# Patient Record
Sex: Female | Born: 1945 | Race: White | Hispanic: No | Marital: Married
Health system: Midwestern US, Academic
[De-identification: ages and names within clinical notes are randomized; demographics above are authoritative.]

---

## 2019-06-29 ENCOUNTER — Encounter: Admit: 2019-06-29 | Discharge: 2019-06-29

## 2019-06-30 NOTE — Telephone Encounter
06/30/19 Records received from Dr Earvin Hansen, Records are available in pt chart through onbase

## 2019-07-03 ENCOUNTER — Encounter: Admit: 2019-07-03 | Discharge: 2019-07-03

## 2019-07-04 ENCOUNTER — Encounter: Admit: 2019-07-04 | Discharge: 2019-07-04

## 2019-07-04 NOTE — Telephone Encounter
-----   Message from Loralie Champagne sent at 07/04/2019 10:20 AM CDT -----  Regarding: New Pt  No Prior Cardiologist, no need to request med recs.       Thank you.

## 2019-07-05 NOTE — Telephone Encounter
07/05/19 - Received records from Dr. Tobey Grim office for appt with Dr. Ricard Dillon on 08/01/19, records have been scanned into the patient's chart via OnBase / sjg

## 2019-07-07 ENCOUNTER — Encounter: Admit: 2019-07-07 | Discharge: 2019-07-07

## 2019-07-07 NOTE — Telephone Encounter
07/07/19 records from Dr Earvin Hansen are scanned into [employer] for the 08/01/19 appt with Dr Ricard Dillon

## 2019-07-10 ENCOUNTER — Encounter: Admit: 2019-07-10 | Discharge: 2019-07-10

## 2019-07-10 DIAGNOSIS — I10 Essential (primary) hypertension: Secondary | ICD-10-CM

## 2019-07-10 DIAGNOSIS — M1990 Unspecified osteoarthritis, unspecified site: Secondary | ICD-10-CM

## 2019-07-10 DIAGNOSIS — K219 Gastro-esophageal reflux disease without esophagitis: Secondary | ICD-10-CM

## 2019-07-10 DIAGNOSIS — K635 Polyp of colon: Secondary | ICD-10-CM

## 2019-07-10 DIAGNOSIS — E785 Hyperlipidemia, unspecified: Secondary | ICD-10-CM

## 2019-07-10 DIAGNOSIS — K228 Other specified diseases of esophagus: Secondary | ICD-10-CM

## 2019-07-10 DIAGNOSIS — E039 Hypothyroidism, unspecified: Secondary | ICD-10-CM

## 2019-07-19 NOTE — Progress Notes
I called the patient to ensure no previous cardiologist. The patient stated that she saw a cardiologist 6- 7 years ago and had a test. She stated that she can't remember who it was and they never sent records to her PCP and she does not have the records. No more recent testing or office visits with cardiologist.

## 2019-07-31 ENCOUNTER — Encounter: Admit: 2019-07-31 | Discharge: 2019-07-31 | Payer: MEDICARE

## 2019-08-01 ENCOUNTER — Encounter: Admit: 2019-08-01 | Discharge: 2019-08-01 | Payer: MEDICARE

## 2019-08-01 ENCOUNTER — Ambulatory Visit: Admit: 2019-08-01 | Discharge: 2019-08-02 | Payer: MEDICARE

## 2019-08-01 MED ORDER — LOSARTAN-HYDROCHLOROTHIAZIDE 100-12.5 MG PO TAB
1 | ORAL_TABLET | Freq: Every morning | ORAL | 3 refills | 28.00000 days | Status: AC
Start: 2019-08-01 — End: ?

## 2019-08-01 MED ORDER — LOSARTAN-HYDROCHLOROTHIAZIDE 50-12.5 MG PO TAB
1 | ORAL_TABLET | Freq: Every morning | ORAL | 3 refills | 28.00000 days | Status: DC
Start: 2019-08-01 — End: 2019-08-01

## 2019-08-01 NOTE — Assessment & Plan Note
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  The fact that she has had this symptom for 6 or 7 years and has never really had any significant problems develop would tend to make me think that this is just something that she is going to need to live with.  I offered referral to a neurologist in the [facility] system but given the fact that she saw a neurologist a few years ago and did not have a particularly good experience, she is not interested at this time.    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  This might explain the lightheadedness that she gets as a part of the syndrome but honestly I do not think it would explain the scotomata.  I really think it is more of a migraine variant without the headache.

## 2019-08-01 NOTE — Assessment & Plan Note
It will be valuable to have her walk on a treadmill so that we can assess blood pressure and heart rate response, as well as any evidence of exercise-induced cardiac ischemia.  Additionally, I ordered an echocardiogram to look for any kind of cardiac structural abnormalities that might cause exertional breathlessness.

## 2019-08-01 NOTE — Patient Instructions
1.  Increase to losartan-hydrochlorothiazide 100-12.5 daily  2.  We'll schedule a stress test and echocardiogram at [facility].

## 2019-08-01 NOTE — Progress Notes
Date of Service: 08/01/2019    Stephanie Green is a 73 y.o. female.       HPI     Stephanie Green and her husband were in the [place] clinic today for consultation regarding exertional breathlessness and lightheadedness.  I had seen him a number of years ago in our [place] [facility] clinic.    She actually has a couple of different issues going on and it took me a little while to sort things out.  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  She has noticed that when she is exposed to certain types of fluorescent lights she gets scotomata and occasionally gets some lightheadedness with the visual field defects.  She rarely gets a little bit of headache afterwards.  The headache is not at all a big part of the syndrome.    She was evaluated by a cardiologist in [place] for this problem 6 years ago.  She recalls having a pharmacologic stress test, and echocardiogram, and possibly ankle-brachial indices at the time.  She does not remember the name of the doctor but her recollection is that the tests were all normal.    She saw a neurologist in [place]. 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    The second problem we talked about today is more recent, probably over the past 2 or 3 years.  She is noticed progressive exertional breathlessness.  She says that she still gets her 10,000 steps in every day and she goes out and walks a mile for exercise but she gets more short of breath climbing hills.  There is no associated chest discomfort or palpitations.    She has had syncope in the past but she attributed this to heat exposure at the time.    She was started on medication for a lipid disorder and hypertension about 6 years ago.  It sounds like these problems are followed jointly by doctors in [place] as well as [place].         Vitals: 08/01/19 0743 08/01/19 0750   BP: (!) 140/80 136/80   BP Source: Arm, Left Upper Arm, Right Upper   Pulse: 67    Weight: 67.1 kg (148 lb)    Height: 1.499 m (4' 11)    PainSc: Zero      Body mass index is 29.89 kg/m?Marland Kitchen     Past Medical History  Patient Active Problem List    Diagnosis Date Noted   ? 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 Exertional dyspnea 08/01/2019         Review of Systems   Constitution: Negative.   HENT: Negative.    Eyes: Negative.    Cardiovascular: Positive for dyspnea on exertion.   Respiratory: Negative.    Endocrine: Negative.    Hematologic/Lymphatic: Negative.    Skin: Negative.    Musculoskeletal: Negative.    Gastrointestinal: Negative.    Genitourinary: Negative.    Neurological: Positive for light-headedness.   Psychiatric/Behavioral: Negative.    Allergic/Immunologic: Negative.        Physical Exam    Physical Exam   General Appearance: no distress   Skin: warm, no ulcers or xanthomas   Digits and Nails: no cyanosis or clubbing   Eyes: conjunctivae and lids normal, pupils are equal and round  Teeth/Gums/Palate: dentition unremarkable, no lesions   Lips & Oral Mucosa: no pallor or cyanosis   Neck Veins: normal JVP , neck veins are not distended   Thyroid: no nodules, masses, tenderness or enlargement   Chest Inspection: chest is normal in appearance   Respiratory Effort: breathing comfortably, no respiratory distress   Auscultation/Percussion: lungs clear to auscultation, no rales or rhonchi, no wheezing   PMI: PMI not enlarged or displaced   Cardiac Rhythm: regular rhythm and normal rate   Cardiac Auscultation: S1, S2 normal, no rub, no gallop   Murmurs: no murmur   Peripheral Circulation: normal peripheral circulation   Carotid Arteries: normal carotid upstroke bilaterally, no bruits   Radial Arteries: normal symmetric radial pulses   Abdominal Aorta: no abdominal aortic bruit   Pedal Pulses: normal symmetric pedal pulses   Lower Extremity Edema: no lower extremity edema Abdominal Exam: soft, non-tender, no masses, bowel sounds normal   Liver & Spleen: no organomegaly   Gait & Station: walks without assistance   Muscle Strength: normal muscle tone   Orientation: oriented to time, place and person   Affect & Mood: appropriate and sustained affect   Language and Memory: patient responsive and seems to comprehend information   Neurologic Exam: neurological assessment grossly intact   Other: moves all extremities      Cardiovascular Studies    EKG:  Sinus rhythm, rate 67.  Normal EKG.    Problems Addressed Today  Encounter Diagnoses   Name Primary?   ? Essential hypertension Yes   ? Flittering scotoma    ? 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  The fact that she has had this symptom for 6 or 7 years and has never really had any significant problems develop would tend to make me think that this is just something that she is going to need to live with.  I offered referral to a neurologist in the [facility] system but given the fact that she saw a neurologist a few years ago and did not have a particularly good experience, she is not interested at this time.    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  This might explain the lightheadedness that she gets as a part of the syndrome but honestly I do not think it would explain the scotomata.  I really think it is more of a migraine variant without the headache.    Exertional dyspnea  It will be valuable to have her walk on a treadmill so that we can assess blood pressure and heart rate response, as well as any evidence of exercise-induced cardiac ischemia.  Additionally, I ordered an echocardiogram to look for any kind of cardiac structural abnormalities that might cause exertional breathlessness. Current Medications (including today's revisions)  ? amLODIPine (NORVASC) 5 mg tablet Take 5 mg by mouth daily.   ? ascorbic acid (VITAMIN C PO) Take 1 capsule by mouth daily.   ? atorvastatin (LIPITOR) 20 mg tablet Take 20 mg by mouth daily.   ? calcium carbonate/vitamin D3 (CALCIUM + D PO) Take 1 capsule by mouth daily.   ? cetirizine HCl (ZYRTEC PO) Take  by mouth as Needed.   ? Cholecalciferol (Vitamin D3) (VITAMIN  D) 25 mcg (1,000 unit) cap Take 1 capsule by mouth daily.   ? famotidine (PEPCID) 40 mg tablet Take 40 mg by mouth daily.   ? flaxseed oil (OMEGA 3 PO) Take 1,000 mg by mouth daily.   ? levothyroxine (SYNTHROID) 25 mcg tablet Take 25 mcg by mouth daily 30 minutes before breakfast.   ? losartan-hydrochlorothiazide (HYZAAR) 100-12.5 mg tablet Take one tablet by mouth every morning.   ? meclizine (ANTIVERT) 25 mg tablet Take 25 mg by mouth twice daily.   ? vitamins, B complex tab Take 1 tablet by mouth daily.

## 2019-08-10 ENCOUNTER — Encounter: Admit: 2019-08-10 | Discharge: 2019-08-10 | Payer: MEDICARE

## 2019-08-10 ENCOUNTER — Ambulatory Visit: Admit: 2019-08-10 | Discharge: 2019-08-10 | Payer: MEDICARE

## 2019-08-10 DIAGNOSIS — R0600 Dyspnea, unspecified: Secondary | ICD-10-CM

## 2019-08-14 NOTE — Progress Notes
Results from [facility] for treadmill stress test:  Conclusions:  1. Nonischemic exercise stress EKG. [facility] treadmill score +9.  2. This is a low risk study.

## 2019-08-25 ENCOUNTER — Encounter: Admit: 2019-08-25 | Discharge: 2019-08-25 | Payer: MEDICARE

## 2019-08-25 NOTE — Telephone Encounter
I called the patient and reviewed results. She stated that she continues to have SOB. She stated that she will follow up with PCP regarding results as Echo normal.    Message   Received: Today   Message Contents   Michiel Cowboy, MD  [facility]; Vickie Epley, RN              This echo looks normal for her--nothing to suggest a cause for dyspnea. Jermond Burkemper, can you please let her know?     Cc: Svea Pusch [place], PA

## 2020-09-16 ENCOUNTER — Encounter: Admit: 2020-09-16 | Discharge: 2020-09-16 | Payer: MEDICARE

## 2020-09-16 DIAGNOSIS — J479 Bronchiectasis, uncomplicated: Secondary | ICD-10-CM

## 2020-10-01 ENCOUNTER — Encounter: Admit: 2020-10-01 | Discharge: 2020-10-01 | Payer: MEDICARE

## 2020-10-01 DIAGNOSIS — E039 Hypothyroidism, unspecified: Secondary | ICD-10-CM

## 2020-10-01 DIAGNOSIS — K635 Polyp of colon: Secondary | ICD-10-CM

## 2020-10-01 DIAGNOSIS — E785 Hyperlipidemia, unspecified: Secondary | ICD-10-CM

## 2020-10-01 DIAGNOSIS — K219 Gastro-esophageal reflux disease without esophagitis: Secondary | ICD-10-CM

## 2020-10-01 DIAGNOSIS — I10 Essential (primary) hypertension: Secondary | ICD-10-CM

## 2020-10-01 DIAGNOSIS — K2289 Esophageal dilatation: Secondary | ICD-10-CM

## 2020-10-01 DIAGNOSIS — M1990 Unspecified osteoarthritis, unspecified site: Secondary | ICD-10-CM

## 2020-10-02 ENCOUNTER — Encounter: Admit: 2020-10-02 | Discharge: 2020-10-02 | Payer: MEDICARE

## 2020-10-02 ENCOUNTER — Ambulatory Visit: Admit: 2020-10-02 | Discharge: 2020-10-02 | Payer: MEDICARE

## 2020-10-02 DIAGNOSIS — R0602 Shortness of breath: Secondary | ICD-10-CM

## 2020-10-03 ENCOUNTER — Encounter: Admit: 2020-10-03 | Discharge: 2020-10-03 | Payer: MEDICARE

## 2020-10-03 DIAGNOSIS — K635 Polyp of colon: Secondary | ICD-10-CM

## 2020-10-03 DIAGNOSIS — K449 Diaphragmatic hernia without obstruction or gangrene: Secondary | ICD-10-CM

## 2020-10-03 DIAGNOSIS — E039 Hypothyroidism, unspecified: Secondary | ICD-10-CM

## 2020-10-03 DIAGNOSIS — M1990 Unspecified osteoarthritis, unspecified site: Secondary | ICD-10-CM

## 2020-10-03 DIAGNOSIS — K2289 Esophageal dilatation: Secondary | ICD-10-CM

## 2020-10-03 DIAGNOSIS — E785 Hyperlipidemia, unspecified: Secondary | ICD-10-CM

## 2020-10-03 DIAGNOSIS — I10 Essential (primary) hypertension: Secondary | ICD-10-CM

## 2020-10-03 DIAGNOSIS — K219 Gastro-esophageal reflux disease without esophagitis: Secondary | ICD-10-CM

## 2020-10-07 ENCOUNTER — Ambulatory Visit: Admit: 2020-10-07 | Discharge: 2020-10-07 | Payer: MEDICARE

## 2020-10-07 ENCOUNTER — Encounter: Admit: 2020-10-07 | Discharge: 2020-10-07 | Payer: MEDICARE

## 2020-10-07 DIAGNOSIS — R0602 Shortness of breath: Secondary | ICD-10-CM

## 2020-10-07 MED ORDER — PERFLUTREN LIPID MICROSPHERES 1.1 MG/ML IV SUSP
1-20 mL | Freq: Once | INTRAVENOUS | 0 refills | Status: CP | PRN
Start: 2020-10-07 — End: ?

## 2020-10-15 ENCOUNTER — Encounter: Admit: 2020-10-15 | Discharge: 2020-10-15 | Payer: MEDICARE

## 2020-10-25 ENCOUNTER — Encounter: Admit: 2020-10-25 | Discharge: 2020-10-25 | Payer: MEDICARE

## 2020-10-25 NOTE — Telephone Encounter
Left a voicemail. Called patient to reschedule their 03-26-2020 appointment.

## 2021-03-19 ENCOUNTER — Encounter: Admit: 2021-03-19 | Discharge: 2021-03-19 | Payer: MEDICARE

## 2021-03-20 ENCOUNTER — Encounter: Admit: 2021-03-20 | Discharge: 2021-03-20 | Payer: MEDICARE

## 2021-03-20 NOTE — Telephone Encounter
Navigation Intake Assessment    Patient Name:  Stephanie Green  DOB: 1945-12-22  Insurance: Medicare   Direct Referral: no  Appointment Info:    Future Appointments   Date Time Provider Department Center   04/02/2021  9:00 AM PF LAB SCHEDULE A [id number] None   04/02/2021 10:00 AM Mitzie Na, MD MPAPULM IM   04/04/2021  9:00 AM Eileen Stanford, MD [facility]  Exam       Diagnosis & Reason for Visit:  Ovarian Mass    Physician Info:  ? Referring Physician:  Rockwell Germany PA  ? Contact Name & Number:  [phone]    Location of Films:   PACS    Location of Pathology:  n/a      History of Present Illness: Pt had annual wellness exam and f/u CT on 5/4 which revealed large 9cm simple appearing cyst in region left adnexal. Possible small mural nodule can be identified versus debris.  pt reports occasional abd pain after exercise no other complaints.    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    03/18/21 [place]: Large cystic lesion left adnexal. possibility underlying cystic neoplasm should be considered.      PMH: hx colon polyps, dyslipidemia, GERD, HTN, Hypothyroid, osteoarthritis    Allergies reviewed and verified with the patient, and documented in Epic:  Yes    Surgical Hx   Surgery/Year: partial hysterectomy early 40's for prolapse, endometriosis,     Reproductive History  Menstrual Hx   LMP:    Having Periods:  Yes   Age at first period: 86    Pregnancy Hx   Number of pregnancies: 2   Number of live births: 2   Age of first live birth: 57   Did you breastfeed: Yes    If Yes, how long?   Oral Birth Control:  Yes   Years: 15 years   Infertility Medication:  No   Year/Med Name:     Menopausal Hx   Age of last period: 42?   Hormone Replacement Therapy:  No   Years:         Health Maintenence  Last Pap: 5years  Abn History of Pap: no    Colonoscopy: yes 2021    Mammogram: yes    Bone scan: 2017    DPOA: yes  husband    Living Will: no            COVID-19 guidelines reviewed with patient, including: visitor and universal masking policies, and a temperature check at the facility entrance upon arrival.

## 2021-03-21 ENCOUNTER — Encounter: Admit: 2021-03-21 | Discharge: 2021-03-21 | Payer: MEDICARE

## 2021-04-02 ENCOUNTER — Encounter: Admit: 2021-04-02 | Discharge: 2021-04-02 | Payer: MEDICARE

## 2021-04-02 ENCOUNTER — Ambulatory Visit: Admit: 2021-04-02 | Discharge: 2021-04-02 | Payer: MEDICARE

## 2021-04-02 DIAGNOSIS — N83209 Unspecified ovarian cyst, unspecified side: Secondary | ICD-10-CM

## 2021-04-02 DIAGNOSIS — I10 Essential (primary) hypertension: Secondary | ICD-10-CM

## 2021-04-02 DIAGNOSIS — K449 Diaphragmatic hernia without obstruction or gangrene: Secondary | ICD-10-CM

## 2021-04-02 DIAGNOSIS — J329 Chronic sinusitis, unspecified: Secondary | ICD-10-CM

## 2021-04-02 DIAGNOSIS — R400 Somnolence: Secondary | ICD-10-CM

## 2021-04-02 DIAGNOSIS — M1990 Unspecified osteoarthritis, unspecified site: Secondary | ICD-10-CM

## 2021-04-02 DIAGNOSIS — K219 Gastro-esophageal reflux disease without esophagitis: Secondary | ICD-10-CM

## 2021-04-02 DIAGNOSIS — K635 Polyp of colon: Secondary | ICD-10-CM

## 2021-04-02 DIAGNOSIS — E785 Hyperlipidemia, unspecified: Secondary | ICD-10-CM

## 2021-04-02 DIAGNOSIS — J479 Bronchiectasis, uncomplicated: Secondary | ICD-10-CM

## 2021-04-02 DIAGNOSIS — E039 Hypothyroidism, unspecified: Secondary | ICD-10-CM

## 2021-04-02 DIAGNOSIS — K2289 Esophageal dilatation: Secondary | ICD-10-CM

## 2021-04-02 DIAGNOSIS — G4709 Other insomnia: Secondary | ICD-10-CM

## 2021-04-02 MED ORDER — FLUTICASONE PROPIONATE 50 MCG/ACTUATION NA SPSN
2 | Freq: Every day | NASAL | 11 refills | 60.00000 days | Status: AC
Start: 2021-04-02 — End: ?

## 2021-04-02 NOTE — Progress Notes
Subjective     GYNECOLOGIC ONCOLOGY EVALUATION    Name:Stephanie Green    Date: 04/04/2021    Referring Physician: Rockwell Germany PA      Primary Care Physician: Rockwell Germany    Chief Complaint:   Chief Complaint   Patient presents with   ? Heme/Onc Care       History of Present Illness:    Stephanie Green is a 75 y.o. female with pelvic mass here for initial consultation. She is accompanied today by her husband, [name].     Pt had annual wellness exam and f/u CT on 5/4   ?  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  ?  03/18/21 [place]: Large 9cm cystic lesion left adnexal. simple appearing cyst in region left adnexal. Possible small mural nodule can be identified versus debris possibility underlying cystic neoplasm should be considered.  ?  Patient reports the following complaints today. She has had intermittent mild nausea over the last several months, but has normal appetite and no vomiting. Her energy level is described as low which she attributes to her trouble sleeping. She states that her oxygen levels are low at night, in the 90s, but has never required home oxygen. She is able to perform her normal activities, but does have SOA when walking up stairs or with brisk exercise. Other longstanding issues are ringing in ears, vertigo, and seasonal allergies.    She has stopped taking her Losartan recently because she attributed her worsening dizziness to this medication.    Denies any vaginal bleeding, vaginal discharge, changes in bowel/bladder function, or chest pain.    Her granddaughter is getting married June 11, so would like to have surgery after that date.    GYN HISTORY:  Menstrual Hx               LMP:                Having Periods:  Yes               Age at first period: 68  ?  Pregnancy Hx               Number of pregnancies: 2               Number of live births: 2               Age of first live birth: 64               Did you breastfeed: Yes                           If Yes, how long?               Oral Birth Control:  Yes               Years: 15 years               Infertility Medication:  No               Year/Med Name:   ?  Menopausal Hx               Age of last period: 51?               Hormone Replacement  Therapy:  No               Years:   ?  ?  ?  Health Maintenence  Last Pap: 5years  Abn History of Pap: no  ?  Colonoscopy: yes 2021  ?  Mammogram: yes  ?  Bone scan: 2017  ?  DPOA: yes  husband  ?  Living Will: no  ?        Past Medical History:  Medical History:   Diagnosis Date   ? Back pain    ? Colon polyp    ? Esophageal dilatation    ? GERD (gastroesophageal reflux disease)    ? Hearing reduced    ? Hiatal hernia    ? HLD (hyperlipidemia)    ? HTN (hypertension)    ? Hypothyroid    ? Osteoarthritis    ? Ovarian cyst        Past Surgical History:  Surgical History:   Procedure Laterality Date   ? COLONOSCOPY  08/16/2017   ? UPPER GASTROINTESTINAL ENDOSCOPY  2019   ? MAMMOGRAM DIAG  03/08/2018   ? COLONOSCOPY  07/2020    benign polyp   ? APPENDECTOMY      PARTIAL   ? HX APPENDECTOMY     ? HYSTERECTOMY     ? TUBAL LIGATION      and excision of endometriosis       Medications:    Current Outpatient Medications:   ?  amLODIPine (NORVASC) 5 mg tablet, Take 5 mg by mouth daily., Disp: , Rfl:   ?  ascorbic acid (VITAMIN C PO), Take 1 capsule by mouth daily., Disp: , Rfl:   ?  atorvastatin (LIPITOR) 20 mg tablet, Take 20 mg by mouth daily., Disp: , Rfl:   ?  calcium carbonate/vitamin D3 (CALCIUM + D PO), Take 1 capsule by mouth daily., Disp: , Rfl:   ?  CALCIUM PO, Take 1,200 mg by mouth daily., Disp: , Rfl:   ?  cetirizine HCl (ZYRTEC PO), Take  by mouth as Needed., Disp: , Rfl:   ?  famotidine (PEPCID) 40 mg tablet, Take 40 mg by mouth daily., Disp: , Rfl:   ?  flaxseed oil (OMEGA 3 PO), Take 1,000 mg by mouth daily., Disp: , Rfl:   ?  fluticasone propionate (FLONASE ALLERGY RELIEF) 50 mcg/actuation nasal spray, suspension, Apply two sprays to each nostril as directed daily. Shake bottle gently before using., Disp: 16 g, Rfl: 11  ?  ibuprofen (ADVIL) 200 mg tablet, Take 200 mg by mouth every 6 hours as needed for Pain. Take with food., Disp: , Rfl:   ?  levothyroxine (SYNTHROID) 25 mcg tablet, Take 25 mcg by mouth daily 30 minutes before breakfast., Disp: , Rfl:   ?  losartan-hydrochlorothiazide (HYZAAR) 100-12.5 mg tablet, Take one tablet by mouth every morning., Disp: 90 tablet, Rfl: 3  ?  meclizine (ANTIVERT) 25 mg tablet, Take 25 mg by mouth twice daily., Disp: , Rfl:   ?  PRESURGERY KIT C, Use as directed.  Indications: ERAS pre-surgical preparation, Disp: 1 kit, Rfl: 0  ?  vitamins, B complex tab, Take 1 tablet by mouth daily., Disp: , Rfl:     Allergies:  No Known Allergies    Social History:  Social History     Socioeconomic History   ? Marital status: Married   ? Number of children: 2   Occupational History   ? Occupation: retired   Tobacco Use   ?  Smoking status: Former Smoker     Packs/day: 0.75     Years: 10.00     Pack years: 7.50     Types: Cigarettes     Quit date: 2010     Years since quitting: 12.4   ? Smokeless tobacco: Never Used   Substance and Sexual Activity   ? Alcohol use: Yes     Alcohol/week: 19.0 standard drinks     Types: 14 Glasses of wine, 5 Standard drinks or equivalent per week     Comment: gin   ? Drug use: Never       Family History:  Family History   Problem Relation Age of Onset   ? Cancer Mother         Kidney   ? Hypertension Mother    ? High Cholesterol Mother    ? Cancer Maternal Grandmother    ? Cancer-Colon Maternal Grandfather    ? Migraines Sister    ? Hypertension Sister    ? High Cholesterol Sister    ? Testicular Cancer Brother    ? Cancer-Uterine Maternal Aunt    ? Cancer-Pancreas Maternal Aunt    ? Cancer Maternal Uncle         brain         REVIEW OF SYSTEMS:        CONSTITUTIONAL: Negative unless stated in HPI  EYES: Negative unless stated in HPI  ENT: Negative unless stated in HPI  RESPIRATORY: Negative unless stated in HPI  CARDIOVASCULAR: Negative unless stated in HPI  GI: Negative unless stated in HPI  GU: Negative unless stated in HPI  MUSCULO-SKELETAL: Negative unless stated in HPI  SKIN: Negative unless stated in HPI  ENDOCRINE: Negative unless stated in HPI  HEMATOLOGIC: Negative unless stated in HPI    ECOG 1     Physical Exam:    BP (!) 165/78 (BP Source: Arm, Left Upper, Patient Position: Sitting)  - Pulse 76  - Temp 36.1 ?C (97 ?F) (Temporal)  - Ht 153 cm (5' 0.24) Comment: 04/04/21 - Wt 67.5 kg (148 lb 12.8 oz)  - SpO2 98%  - BMI 28.83 kg/m?   GENERAL APPEARANCE: Appears healthy.  Alert; in no acute distress.  Pleasant.  HEENT: Unremarkable. No tenderness or masses noted.  NECK: Neck supple. No tenderness. No adenopathy.    LUNGS: Chest symmetrical. Good diaphragmatic excursion. Lungs clear; normal breath sounds.  CARDIOVASCULAR:  RRR. Heart sounds normal.      ABDOMEN: Abdomen soft, non-tender.  No masses, organomegaly, or hernia. No clinical evidence of ascites.    PELVIC:   External genitalia, anus, perineum, urethral meatus, urethra, bladder and vagina normal. Cervix, uterus surgically absent, adnexae not palpable.  Bimanual and rectovaginal exam without masses or nodularity on the pelvic floor.  Exam chaperoned by nurse  EXTREMITIES: Extremities normal. No joint deformities, edema, or skin discoloration. Station and gait normal.  SKIN: Skin color, texture, turgor normal. No rashes or lesions.  LYMPH NODES: No palpable supraclavicular or inguinal lymph nodes.    CBC w/Diff    Lab Results   Component Value Date/Time    WBC 5.7 04/04/2021 10:09 AM    RBC 3.89 (L) 04/04/2021 10:09 AM    HGB 8.5 (L) 04/04/2021 10:09 AM    HCT 28.2 (L) 04/04/2021 10:09 AM    MCV 72.5 (L) 04/04/2021 10:09 AM    MCH 22.0 (L) 04/04/2021 10:09 AM    MCHC 30.3 (L) 04/04/2021 10:09 AM    RDW  16.8 (H) 04/04/2021 10:09 AM    PLTCT 303 04/04/2021 10:09 AM    MPV 8.3 04/04/2021 10:09 AM    No results found for: NEUT, ANC, LYMA, ALC, MONA, AMC, EOSA, AEC, BASA, ABC       Comprehensive Metabolic Profile    Lab Results   Component Value Date/Time    NA 144 04/04/2021 10:09 AM    K 4.1 04/04/2021 10:09 AM    CL 106 04/04/2021 10:09 AM    CO2 26 04/04/2021 10:09 AM    GAP 12 04/04/2021 10:09 AM    BUN 11 04/04/2021 10:09 AM    CR 0.68 04/04/2021 10:09 AM    GLU 92 04/04/2021 10:09 AM    Lab Results   Component Value Date/Time    CA 9.0 04/04/2021 10:09 AM    ALBUMIN 4.3 04/04/2021 10:09 AM    TOTPROT 6.9 04/04/2021 10:09 AM    ALKPHOS 68 04/04/2021 10:09 AM    AST 23 04/04/2021 10:09 AM    ALT 36 04/04/2021 10:09 AM    TOTBILI 0.4 04/04/2021 10:09 AM    GFR 85.5 03/08/2018 12:00 AM          Other labs    No results found for: ESR No results found for: LDH         ASSESSMENT/PLAN:  Valley Giove is a 75 y.o. female  with complex adnexal cyst. A detailed discussion was held with Cassell Smiles regarding her ovarian cyst and her management options.  I noted that several things are reassuring which confer a low probability for malignancy; notably, a normal CA-125, the lack of significant complex architectural features on ultrasound, absense of free fluid in the pelvis, the lack of significant abnormalities on gynecologic exam.     I noted that the probability of an ovarian malignancy in the setting of ovarian cysts that have a simple architecture is less than 1% in menopausal women.  In those with complex cysts, the risk of malignancy is higher; however, I opined that her risk of cancer should be low.     We discussed 2 general management options:    1) Surgical intervention with an attempt at laparoscopic oophorectomy or laparoscopic bilateral salpingo-oophorectomy.  I noted this would be the most definitive approach to diagnose and treat the ovarian lesion.     2) The alternative would be to continue close monitoring with serial examinations, ultrasounds, and CA-125s with a plan to intervene surgically if any one of these tests had a change in the abnormal direction, or if the patient developed pelvic symptoms.  It is possible that the conservative surveillance approach would require indefinite lifelong surveillance and possibly, ultimately lead to surgical intervention, should the patient become symptomatic or have an abnormal change in either a blood test, physical exam, or ultrasound.    We discussed the risks of the surgical intervention,  which I suspect would be a low risk given the patient's thin habitus and lack of significant medical history.  The risk of the surveillance approach would be the risk of a delay in diagnosis of a significant ovarian neoplasm, or the risk that she would ultimately require surgical intervention, either when she had developed a significant neoplasm on the ovary, or perhaps when she would be less fit for surgery, many years from now.    After consideration of the options, the patient would like to have the ovarian lesion treated. We discussed the steps of the procedure, and the pros and  cons of taking out the contralateral ovary (including lessening in risk of development of benign or malignant lesions in future, versus possible increase in cardiovascular risk), and the steps involved in surgical staging if she has a malignancy on frozen section.  She elected to undergo an laparoscopic bilateral salpingo-oophorectomy.     We discussed the risks of surgery and she underwent an informed consent discussion. She was counseled regarding the risks of surgery, which include but are not limited to:  Medical-  the risks of heart attack, stroke, pneumonia, DVT and   Surgical: which include the risks of damage to the GI or GU tract, risks of pelvic or other infections, risk of injury to nerves, arteries, or veins, risks of lymphocysts,  risks of DVT, and the risks of blood transfusion, including the risk of aids and hepatitis. The patient was given ample opportunity to ask questions. I believe she understands the rationale for surgery and is aware of the risks and fully consents to the procedure.  The surgical plan is to attempt a laparoscopic BSO, and frozen section;  if an ovarian cancer is diagnosed, she will undergo full staging and debulking if necessary.  ? Will plan for surgery after 6/11 to allow for her to attend wedding.  ? Pre-op labs: CBC, BMP, CA-125  ? If she were to be admitted postoperatively, will need AWAS protocol      Total Time Today was 60 minutes in the following activities: Preparing to see the patient, Obtaining and/or reviewing separately obtained history, Performing a medically appropriate examination and/or evaluation, Counseling and educating the patient/family/caregiver, Ordering medications, tests, or procedures, Referring and communication with other health care professionals (when not separately reported), Documenting clinical information in the electronic or other health record, Independently interpreting results (not separately reported) and communicating results to the patient/family/caregiver and Care coordination (not separately reported)       Eileen Stanford, MD

## 2021-04-02 NOTE — Research Notes
Research Informed Consent Note    NAME: Stephanie Green             MRN: [id number]             DOB:09-21-1946          AGE: 75 y.o.    IRB Number: [id number]  Consent Approval Dates: (27 Aug 2020)  Clinical research participation and research nature of the trial were discussed with subject. The subject was alert and oriented during consent discussion and was accompanied by her husband. Subject was informed that study is voluntary and  she may withdraw consent at any time for any reason by notifying study team.  Study purpose, procedures, benefits, risks and duration of the study, confidentiality information and compensation were discussed.  Alternatives to participation were discussed per consent form.  Subject verbalized understanding.    Subject was provided time to review the consent form.  All questions asked were answered.  Subject voiced desire to participate in the study and signed the informed consent form without coercion and undue influence.  A copy of the signed consent was given to the subject as well as contact information for the study team.  A copy of the consent form was e-mailed to [facility] Information Management (HIM) for scanning into the subject's medical record.    No research procedures took place prior to consenting.

## 2021-04-04 ENCOUNTER — Encounter: Admit: 2021-04-04 | Discharge: 2021-04-04 | Payer: MEDICARE

## 2021-04-04 DIAGNOSIS — K219 Gastro-esophageal reflux disease without esophagitis: Secondary | ICD-10-CM

## 2021-04-04 DIAGNOSIS — K635 Polyp of colon: Secondary | ICD-10-CM

## 2021-04-04 DIAGNOSIS — K449 Diaphragmatic hernia without obstruction or gangrene: Secondary | ICD-10-CM

## 2021-04-04 DIAGNOSIS — K2289 Esophageal dilatation: Secondary | ICD-10-CM

## 2021-04-04 DIAGNOSIS — N9489 Other specified conditions associated with female genital organs and menstrual cycle: Secondary | ICD-10-CM

## 2021-04-04 DIAGNOSIS — H9190 Unspecified hearing loss, unspecified ear: Secondary | ICD-10-CM

## 2021-04-04 DIAGNOSIS — E785 Hyperlipidemia, unspecified: Secondary | ICD-10-CM

## 2021-04-04 DIAGNOSIS — E039 Hypothyroidism, unspecified: Secondary | ICD-10-CM

## 2021-04-04 DIAGNOSIS — R1900 Intra-abdominal and pelvic swelling, mass and lump, unspecified site: Secondary | ICD-10-CM

## 2021-04-04 DIAGNOSIS — R971 Elevated cancer antigen 125 [CA 125]: Secondary | ICD-10-CM

## 2021-04-04 DIAGNOSIS — M1990 Unspecified osteoarthritis, unspecified site: Secondary | ICD-10-CM

## 2021-04-04 DIAGNOSIS — I10 Essential (primary) hypertension: Secondary | ICD-10-CM

## 2021-04-04 DIAGNOSIS — N83209 Unspecified ovarian cyst, unspecified side: Secondary | ICD-10-CM

## 2021-04-04 DIAGNOSIS — M549 Dorsalgia, unspecified: Secondary | ICD-10-CM

## 2021-04-04 LAB — COMPREHENSIVE METABOLIC PANEL
ALBUMIN: 4.3 g/dL (ref 3.5–5.0)
ALK PHOSPHATASE: 68 U/L (ref 25–110)
ALT: 36 U/L (ref 7–56)
AST: 23 U/L (ref 7–40)
BLD UREA NITROGEN: 11 mg/dL — ABNORMAL LOW (ref 7–25)
CALCIUM: 9 mg/dL (ref 8.5–10.6)
CO2: 26 MMOL/L (ref 21–30)
CREATININE: 0.6 mg/dL — ABNORMAL HIGH (ref 0.4–1.00)
EGFR: >60 mL/min (ref >60)
GLUCOSE,PANEL: 92 mg/dL — ABNORMAL LOW (ref 70–100)
POTASSIUM: 4.1 MMOL/L — ABNORMAL LOW (ref 3.5–5.1)
SODIUM: 144 MMOL/L — ABNORMAL LOW (ref 137–147)
TOTAL BILIRUBIN: 0.4 mg/dL (ref 0.3–1.2)
TOTAL PROTEIN: 6.9 g/dL (ref 6.0–8.0)

## 2021-04-04 LAB — CA125: CA-125: 15 U/mL (ref <35)

## 2021-04-04 LAB — CBC
MCV: 72 FL — ABNORMAL LOW (ref 80–100)
RBC COUNT: 3.8 M/UL — ABNORMAL LOW (ref 4.0–5.0)
WBC COUNT: 5.7 K/UL (ref 4.5–11.0)

## 2021-04-04 MED ORDER — GABAPENTIN 300 MG PO CAP
600 mg | Freq: Once | ORAL | 0 refills
Start: 2021-04-04 — End: ?

## 2021-04-04 MED ORDER — ACETAMINOPHEN 500 MG PO TAB
1000 mg | Freq: Once | ORAL | 0 refills
Start: 2021-04-04 — End: ?

## 2021-04-04 MED ORDER — SODIUM CHLORIDE 0.9 % IV SOLP
250 mL | INTRAVENOUS | 0 refills
Start: 2021-04-04 — End: ?

## 2021-04-04 MED ORDER — PRESURGERY KIT C
PACK | 0 refills | Status: AC
Start: 2021-04-04 — End: ?
  Filled 2021-04-04: qty 1, 1d supply, fill #1

## 2021-04-04 MED ORDER — CELECOXIB 100 MG PO CAP
200 mg | Freq: Once | ORAL | 0 refills
Start: 2021-04-04 — End: ?

## 2021-04-04 NOTE — Telephone Encounter
Received incoming VM from patient after being seen in clinic today by Dr. Martin Majestic. She has questions about the Presurgery Kit that was prescribed and if she needs to pick it up in [place]. This RN called patient to inform her that this kit is part of the surgery orders. It will be mailed to her from our specialty pharmacy and we will discuss it's contents and what to do with them as it gets closer to surgery. Patient verbalizes understanding and has no questions.

## 2021-04-10 ENCOUNTER — Ambulatory Visit: Admit: 2021-04-10 | Discharge: 2021-04-10 | Payer: MEDICARE

## 2021-04-10 DIAGNOSIS — R1900 Intra-abdominal and pelvic swelling, mass and lump, unspecified site: Secondary | ICD-10-CM

## 2021-04-15 ENCOUNTER — Encounter: Admit: 2021-04-15 | Discharge: 2021-04-15 | Payer: MEDICARE

## 2021-04-15 NOTE — Telephone Encounter
Stephanie Green  [id number]  Physician:  Roderic Scarce  Diagnosis:  Pelvic Mass  Surgery:  [facility]  Date of surgery: 6/23    Preop check list:    []  Preop appointment:  N/A                            [x]  Pre-anesthesia appointment:  pre anesthesia phone call  []  Bowel prep:   N/A          []  Medical clearance:  N/A      []  Cardiology clearance:  N/A  [x]  Other:  7/20- [facility] @ 1130

## 2021-04-16 ENCOUNTER — Encounter: Admit: 2021-04-16 | Discharge: 2021-04-16 | Payer: MEDICARE

## 2021-04-22 ENCOUNTER — Encounter: Admit: 2021-04-22 | Discharge: 2021-04-22 | Payer: MEDICARE

## 2021-04-22 NOTE — Telephone Encounter
This RN called patient to review pre/post op education. All questions were able to be answered. Patient denies any further questions or concerns.

## 2021-04-24 ENCOUNTER — Encounter: Admit: 2021-04-24 | Discharge: 2021-04-24 | Payer: MEDICARE

## 2021-04-24 ENCOUNTER — Ambulatory Visit: Admit: 2021-04-24 | Discharge: 2021-04-24 | Payer: MEDICARE

## 2021-04-24 DIAGNOSIS — H9190 Unspecified hearing loss, unspecified ear: Secondary | ICD-10-CM

## 2021-04-24 DIAGNOSIS — K2289 Esophageal dilatation: Secondary | ICD-10-CM

## 2021-04-24 DIAGNOSIS — E039 Hypothyroidism, unspecified: Secondary | ICD-10-CM

## 2021-04-24 DIAGNOSIS — N83209 Unspecified ovarian cyst, unspecified side: Secondary | ICD-10-CM

## 2021-04-24 DIAGNOSIS — K635 Polyp of colon: Secondary | ICD-10-CM

## 2021-04-24 DIAGNOSIS — I10 Essential (primary) hypertension: Secondary | ICD-10-CM

## 2021-04-24 DIAGNOSIS — K449 Diaphragmatic hernia without obstruction or gangrene: Secondary | ICD-10-CM

## 2021-04-24 DIAGNOSIS — K219 Gastro-esophageal reflux disease without esophagitis: Secondary | ICD-10-CM

## 2021-04-24 DIAGNOSIS — E785 Hyperlipidemia, unspecified: Secondary | ICD-10-CM

## 2021-04-24 DIAGNOSIS — M1990 Unspecified osteoarthritis, unspecified site: Secondary | ICD-10-CM

## 2021-04-24 DIAGNOSIS — Z01818 Encounter for other preprocedural examination: Secondary | ICD-10-CM

## 2021-04-24 DIAGNOSIS — M549 Dorsalgia, unspecified: Secondary | ICD-10-CM

## 2021-04-24 LAB — CBC AND DIFF
ABSOLUTE BASO COUNT: 0 K/UL (ref 0–0.20)
ABSOLUTE EOS COUNT: 0.2 K/UL (ref 0–0.45)
ABSOLUTE LYMPH COUNT: 1.4 K/UL (ref 1.0–4.8)
ABSOLUTE MONO COUNT: 0.7 K/UL (ref 0–0.80)
ABSOLUTE NEUTROPHIL: 2.8 K/UL (ref 1.8–7.0)
BASOPHILS %: 1 % (ref 0–2)
EOSINOPHILS %: 4 % (ref 0–5)
HEMATOCRIT: 28 % — ABNORMAL LOW (ref 36–45)
HEMOGLOBIN: 8.5 g/dL — ABNORMAL LOW (ref 12.0–15.0)
LYMPHOCYTES %: 26 % (ref 24–44)
MCH: 21 pg — ABNORMAL LOW (ref 26–34)
MCHC: 29 g/dL — ABNORMAL LOW (ref 32.0–36.0)
MCV: 71 FL — ABNORMAL LOW (ref 80–100)
MONOCYTES %: 15 % — ABNORMAL HIGH (ref 4–12)
MPV: 8.4 FL (ref 7–11)
NEUTROPHILS %: 54 % (ref 41–77)
PLATELET COUNT: 255 K/UL — AB (ref 150–400)
RBC COUNT: 3.9 M/UL — ABNORMAL LOW (ref 4.0–5.0)
RDW: 16 % — ABNORMAL HIGH (ref 11–15)
WBC COUNT: 5.3 K/UL (ref 4.5–11.0)

## 2021-04-24 NOTE — Pre-Anesthesia Patient Instructions
GENERAL INFORMATION    Before you come to the hospital  If you are having outpatient surgery, make arrangements for a responsible adult to drive you home and stay with you for 24 hours following surgery.  Bath/Shower Instructions  Follow the specific instructions provided to you in your surgical kit from Dr Dicie Beam office  Put on clean clothes after bath or shower.  Avoid using lotion and oils.  Sleep on clean sheets if bath or shower is done the night before procedure.  Leave money, credit cards, jewelry, and any other valuables at home. [facility] is not responsible for the loss or breakage of personal items.  Remove nail polish, makeup and all jewelry (including piercings) before coming to the hospital.  The morning of your procedure:  brush your teeth and tongue  do not smoke  do not shave the area where you will have surgery    If you are having an outpatient procedure, you will need to arrange for a responsible ride/person to accompany you home due to sedation or anesthesia with your procedure. A responsible person is a person who has the ability to identify a change in the patient's status and notify medical personnel.  This is typically a family member or friend.  Public transportation is permitted if you have a responsible person to accompany you.  An [employer], taxi or other public transportation driver is not considered a responsible person to accompany you home.    What to bring to the hospital  ID/ Insurance Card  Medical Device card  Official documents for legal guardianship   Copy of your Living Will, Advanced Directives, and/or Durable Power of Attorney   Small bag with a few personal belongings  CPAP/BiPAP machine (including all supplies)  Walker,cane, or motorized scooter  Cases for glasses/hearing aids/contact lens (bring solutions for contacts)  Dress in clean, loose, comfortable clothing     Eating or drinking before surgery  Follow the specific instructions provided to you in your surgical kit from Dr Dicie Beam office     Other instructions  Notify your surgeon if:  there is a possibility that you are pregnant  you become ill with a cough, fever, sore throat, nausea, vomiting or flu-like symptoms  you have any open wounds/sores that are red, painful, draining, or are new since you last saw  the doctor  you need to cancel your procedure  You will receive a call with your surgery arrival time from between 2:30pm and 4:30pm the last business day before your procedure.  If you do not receive a call, please call [phone] before 4:30pm or [phone] after 4:30pm.Phone carriers that use spam blockers will sometimes block our phone numbers. If your phone contact number is a mobile phone, please adjust your settings to make sure you receive our call.  In your phone settings, turn OFF the setting ?silence unknown callers.?  Please add these phone numbers to your contacts [phone] & [phone]    Notify us at [facility]: [phone]  if you need to cancel your procedure  if you are going to be late    Arrival at the hospital    [facility]  [address]  [place], [place] [address]    Park in the [employer], located directly across from the main entrance to the hospital.  Judee Clara parking is available  from 7 AM to 4 PM Monday through Friday.  Enter through the ground floor main  hospital entrance and check in at the Information Desk in the lobby.  They will validate your parking ticket and direct you to the next location.  If you are a woman between the ages of 59 and 4, and have not had a hysterectomy, you will be asked for a urine sample prior to surgery.  Please do not urinate before arriving in the Surgery Waiting Room.  Once there, check in and let the attendant know if you need to provide a sample.    Coronavirus (COVID19) Information  If you get sick with fever (100.94F/38C or higher), cough, or have trouble breathing:  Call your primary care physician for questions or health needs.  Notify your surgeon if you are COVID+ positive.    For up to date information on the Coronavirus, visit the CDC website at DiningCalendar.de.    For the safety of all patients, visitors and staff as we work to contain COVID-19, we must restrict patient visitors.    Current Visitor Policy (02/19/21):    Our current, and ongoing, visitor rules in surgery and procedural areas are:    1 visitor per patient will be allowed to accompany the patient and wait in the Waiting Room  No visitors will be allowed into the pre/post areas     Patients in inpatient and pediatric units, Emergency Department, ambulatory clinics and lab appointments may only have two visitors.     For inpatient stays, patients may have 2 visitors at a time at their bedside. The two visitors can change throughout the day, but no more than two at a time may be bedside.  The policy applies to [facility] [place] [facility]s [place], [address], [employer] and [place] campuses and clinics.    Exceptions include:  Visitors will no longer be restricted from patients with active COVID-19 infections but should continue to follow our visitation guidelines including screening and masking.  Children younger than age 33 are once again allowed to visit inpatients.  Parents or guardians may bring their children to their clinic appointments or bring siblings to their children's clinic appointment.  One visitor allowed during a patient?s cancer exam appointment; however, no visitors allowed during their cancer treatment/infusion appointment; check with staff for exceptions.  One visitor allowed in perioperative and procedural waiting rooms; no visitors allowed in pre/post areas unless a patient becomes an overnight boarder.  Two parents/guardians are allowed for surgical or procedural patients younger than 77 years old.  Adult inpatients in semiprivate rooms may have visitors, but visits should be coordinated so only two total visitors are in a room at a time due to space limitations.    Visitors must be free of fever and symptoms to be in our facilities. We ask visitors to follow these guidelines:  Wear a mask at all times, unless under the age of 2, have trouble breathing or are unconscious, incapacitated or otherwise unable to remove the cover without assistance.  Go directly to the nursing station in the unit you are visiting and do not linger in public areas.  Check in at the nursing station before going to the patient's room.  Maintain a physical distance of six feet from all others.  Follow elevator restrictions to four riding at a time - peak times are 6:30-7:30 a.m., noon and 6:30-7:30 p.m.  Be aware cafeteria peak times are 11 a.m. - 1 p.m.  Wash your hands frequently and cover your coughs and sneezes.

## 2021-04-25 ENCOUNTER — Encounter: Admit: 2021-04-25 | Discharge: 2021-04-25 | Payer: MEDICARE

## 2021-04-29 ENCOUNTER — Encounter: Admit: 2021-04-29 | Discharge: 2021-04-29 | Payer: MEDICARE

## 2021-04-30 ENCOUNTER — Encounter: Admit: 2021-04-30 | Discharge: 2021-04-30 | Payer: MEDICARE

## 2021-04-30 NOTE — Telephone Encounter
Called patient informing of 0730 cancellation, states she can present for 0730 start. Confirmed with OR scheduling, case moved to 0730.    Christiana Pellant, MD, MPH  PGY 3 Obstetrics and Gynecology

## 2021-05-01 ENCOUNTER — Encounter: Admit: 2021-05-01 | Discharge: 2021-05-01 | Payer: MEDICARE

## 2021-05-01 ENCOUNTER — Ambulatory Visit: Admit: 2021-05-01 | Discharge: 2021-05-01 | Payer: MEDICARE

## 2021-05-01 DIAGNOSIS — I10 Essential (primary) hypertension: Secondary | ICD-10-CM

## 2021-05-01 DIAGNOSIS — N83209 Unspecified ovarian cyst, unspecified side: Secondary | ICD-10-CM

## 2021-05-01 DIAGNOSIS — K635 Polyp of colon: Secondary | ICD-10-CM

## 2021-05-01 DIAGNOSIS — K219 Gastro-esophageal reflux disease without esophagitis: Secondary | ICD-10-CM

## 2021-05-01 DIAGNOSIS — E039 Hypothyroidism, unspecified: Secondary | ICD-10-CM

## 2021-05-01 DIAGNOSIS — M1990 Unspecified osteoarthritis, unspecified site: Secondary | ICD-10-CM

## 2021-05-01 DIAGNOSIS — K2289 Esophageal dilatation: Secondary | ICD-10-CM

## 2021-05-01 DIAGNOSIS — K449 Diaphragmatic hernia without obstruction or gangrene: Secondary | ICD-10-CM

## 2021-05-01 DIAGNOSIS — H9190 Unspecified hearing loss, unspecified ear: Secondary | ICD-10-CM

## 2021-05-01 DIAGNOSIS — M549 Dorsalgia, unspecified: Secondary | ICD-10-CM

## 2021-05-01 DIAGNOSIS — E785 Hyperlipidemia, unspecified: Secondary | ICD-10-CM

## 2021-05-01 MED ORDER — SUGAMMADEX 100 MG/ML IV SOLN
INTRAVENOUS | 0 refills | Status: DC
Start: 2021-05-01 — End: 2021-05-01
  Administered 2021-05-01: 14:00:00 200 mg via INTRAVENOUS

## 2021-05-01 MED ORDER — ELECTROLYTE-A IV SOLP
INTRAVENOUS | 0 refills | Status: DC
Start: 2021-05-01 — End: 2021-05-01
  Administered 2021-05-01: 13:00:00 via INTRAVENOUS

## 2021-05-01 MED ORDER — ONDANSETRON HCL (PF) 4 MG/2 ML IJ SOLN
INTRAVENOUS | 0 refills | Status: DC
Start: 2021-05-01 — End: 2021-05-01
  Administered 2021-05-01: 14:00:00 4 mg via INTRAVENOUS

## 2021-05-01 MED ORDER — DEXAMETHASONE SODIUM PHOSPHATE 4 MG/ML IJ SOLN
INTRAVENOUS | 0 refills | Status: DC
Start: 2021-05-01 — End: 2021-05-01
  Administered 2021-05-01: 13:00:00 4 mg via INTRAVENOUS

## 2021-05-01 MED ORDER — LIDOCAINE (PF) 200 MG/10 ML (2 %) IJ SYRG
INTRAVENOUS | 0 refills | Status: DC
Start: 2021-05-01 — End: 2021-05-01
  Administered 2021-05-01: 13:00:00 80 mg via INTRAVENOUS

## 2021-05-01 MED ORDER — FENTANYL CITRATE (PF) 50 MCG/ML IJ SOLN
INTRAVENOUS | 0 refills | Status: DC
Start: 2021-05-01 — End: 2021-05-01
  Administered 2021-05-01 (×2): 50 ug via INTRAVENOUS
  Administered 2021-05-01: 14:00:00 100 ug via INTRAVENOUS

## 2021-05-01 MED ORDER — PROPOFOL INJ 10 MG/ML IV VIAL
INTRAVENOUS | 0 refills | Status: DC
Start: 2021-05-01 — End: 2021-05-01
  Administered 2021-05-01: 13:00:00 100 mg via INTRAVENOUS

## 2021-05-01 MED ORDER — ARTIFICIAL TEARS (PF) SINGLE DOSE DROPS GROUP
OPHTHALMIC | 0 refills | Status: DC
Start: 2021-05-01 — End: 2021-05-01
  Administered 2021-05-01: 13:00:00 2 [drp] via OPHTHALMIC

## 2021-05-01 MED ORDER — ROCURONIUM 10 MG/ML IV SOLN
INTRAVENOUS | 0 refills | Status: DC
Start: 2021-05-01 — End: 2021-05-01
  Administered 2021-05-01: 13:00:00 45 mg via INTRAVENOUS
  Administered 2021-05-01: 13:00:00 5 mg via INTRAVENOUS

## 2021-05-01 MED ORDER — SUCCINYLCHOLINE CHLORIDE 20 MG/ML IJ SOLN
INTRAVENOUS | 0 refills | Status: DC
Start: 2021-05-01 — End: 2021-05-01
  Administered 2021-05-01: 13:00:00 140 mg via INTRAVENOUS

## 2021-05-01 MED ADMIN — ACETAMINOPHEN 500 MG PO TAB [102]: 1000 mg | ORAL | @ 12:00:00 | Stop: 2021-05-01 | NDC 00904673061

## 2021-05-01 MED ADMIN — GABAPENTIN 300 MG PO CAP [18308]: 600 mg | ORAL | @ 12:00:00 | Stop: 2021-05-01 | NDC 67877022305

## 2021-05-01 MED ADMIN — DIPHENHYDRAMINE HCL 50 MG/ML IJ SOLN [2508]: 25 mg | INTRAVENOUS | @ 15:00:00 | Stop: 2021-05-01 | NDC 63323066400

## 2021-05-01 MED ADMIN — BUPIVACAINE HCL 0.25 % (2.5 MG/ML) IJ SOLN [1222]: 8 mL | INTRAMUSCULAR | @ 14:00:00 | Stop: 2021-05-01 | NDC 63323046501

## 2021-05-01 MED ADMIN — SODIUM CHLORIDE 0.9 % IV SOLP [27838]: 250 mL | INTRAVENOUS | @ 12:00:00 | Stop: 2021-05-01 | NDC 00338004902

## 2021-05-01 MED ADMIN — CELECOXIB 200 MG PO CAP [76958]: 200 mg | ORAL | @ 12:00:00 | Stop: 2021-05-01 | NDC 00904650361

## 2021-05-01 MED FILL — IBUPROFEN 600 MG PO TAB: 600 mg | ORAL | 12 days supply | Qty: 45 | Fill #1 | Status: CP

## 2021-05-01 MED FILL — ONDANSETRON 4 MG PO TBDI: 4 mg | ORAL | 20 days supply | Qty: 60 | Fill #1 | Status: CP

## 2021-05-01 MED FILL — OXYCODONE 5 MG PO TAB: 5 mg | ORAL | 3 days supply | Qty: 10 | Fill #1 | Status: CP

## 2021-05-03 ENCOUNTER — Encounter: Admit: 2021-05-03 | Discharge: 2021-05-03 | Payer: MEDICARE

## 2021-05-03 DIAGNOSIS — M549 Dorsalgia, unspecified: Secondary | ICD-10-CM

## 2021-05-03 DIAGNOSIS — N83209 Unspecified ovarian cyst, unspecified side: Secondary | ICD-10-CM

## 2021-05-03 DIAGNOSIS — K2289 Esophageal dilatation: Secondary | ICD-10-CM

## 2021-05-03 DIAGNOSIS — K219 Gastro-esophageal reflux disease without esophagitis: Secondary | ICD-10-CM

## 2021-05-03 DIAGNOSIS — E039 Hypothyroidism, unspecified: Secondary | ICD-10-CM

## 2021-05-03 DIAGNOSIS — E785 Hyperlipidemia, unspecified: Secondary | ICD-10-CM

## 2021-05-03 DIAGNOSIS — M1990 Unspecified osteoarthritis, unspecified site: Secondary | ICD-10-CM

## 2021-05-03 DIAGNOSIS — K449 Diaphragmatic hernia without obstruction or gangrene: Secondary | ICD-10-CM

## 2021-05-03 DIAGNOSIS — I10 Essential (primary) hypertension: Secondary | ICD-10-CM

## 2021-05-03 DIAGNOSIS — K635 Polyp of colon: Secondary | ICD-10-CM

## 2021-05-03 DIAGNOSIS — H9190 Unspecified hearing loss, unspecified ear: Secondary | ICD-10-CM

## 2021-05-08 ENCOUNTER — Encounter: Admit: 2021-05-08 | Discharge: 2021-05-08 | Payer: MEDICARE

## 2021-05-08 NOTE — Telephone Encounter
Received VM from [facility] asking for a return call to discuss questions she has about her post op restrictions.  I returned her call to discus further.  Stephanie Green is wondering if she can play in a golf tournament over the holiday weekend.  We discussed that she is only a week post op and we would not recommend that she play in a golf tournament this soon after surgery due to the amount of force used when swinging a gold club and the amount of pressure that will put on her incisions.  She understands and is agreable to not playing a full golf game.  We also discussed her post op restrictions and that they are in place for at least 6 weeks after her surgery.  She has no further questions at this time.

## 2021-05-12 ENCOUNTER — Encounter: Admit: 2021-05-12 | Discharge: 2021-05-12 | Payer: MEDICARE

## 2021-05-28 ENCOUNTER — Encounter: Admit: 2021-05-28 | Discharge: 2021-05-28 | Payer: MEDICARE

## 2021-05-28 DIAGNOSIS — H9190 Unspecified hearing loss, unspecified ear: Secondary | ICD-10-CM

## 2021-05-28 DIAGNOSIS — K219 Gastro-esophageal reflux disease without esophagitis: Secondary | ICD-10-CM

## 2021-05-28 DIAGNOSIS — N83209 Unspecified ovarian cyst, unspecified side: Secondary | ICD-10-CM

## 2021-05-28 DIAGNOSIS — K2289 Esophageal dilatation: Secondary | ICD-10-CM

## 2021-05-28 DIAGNOSIS — M549 Dorsalgia, unspecified: Secondary | ICD-10-CM

## 2021-05-28 DIAGNOSIS — I10 Essential (primary) hypertension: Secondary | ICD-10-CM

## 2021-05-28 DIAGNOSIS — K635 Polyp of colon: Secondary | ICD-10-CM

## 2021-05-28 DIAGNOSIS — M1990 Unspecified osteoarthritis, unspecified site: Secondary | ICD-10-CM

## 2021-05-28 DIAGNOSIS — E785 Hyperlipidemia, unspecified: Secondary | ICD-10-CM

## 2021-05-28 DIAGNOSIS — E039 Hypothyroidism, unspecified: Secondary | ICD-10-CM

## 2021-05-28 DIAGNOSIS — K449 Diaphragmatic hernia without obstruction or gangrene: Secondary | ICD-10-CM

## 2021-05-30 ENCOUNTER — Encounter: Admit: 2021-05-30 | Discharge: 2021-05-30 | Payer: MEDICARE

## 2021-05-30 DIAGNOSIS — K219 Gastro-esophageal reflux disease without esophagitis: Secondary | ICD-10-CM

## 2021-05-30 DIAGNOSIS — H9190 Unspecified hearing loss, unspecified ear: Secondary | ICD-10-CM

## 2021-05-30 DIAGNOSIS — M549 Dorsalgia, unspecified: Secondary | ICD-10-CM

## 2021-05-30 DIAGNOSIS — I10 Essential (primary) hypertension: Secondary | ICD-10-CM

## 2021-05-30 DIAGNOSIS — N83209 Unspecified ovarian cyst, unspecified side: Secondary | ICD-10-CM

## 2021-05-30 DIAGNOSIS — K449 Diaphragmatic hernia without obstruction or gangrene: Secondary | ICD-10-CM

## 2021-05-30 DIAGNOSIS — E039 Hypothyroidism, unspecified: Secondary | ICD-10-CM

## 2021-05-30 DIAGNOSIS — M1990 Unspecified osteoarthritis, unspecified site: Secondary | ICD-10-CM

## 2021-05-30 DIAGNOSIS — E785 Hyperlipidemia, unspecified: Secondary | ICD-10-CM

## 2021-05-30 DIAGNOSIS — K635 Polyp of colon: Secondary | ICD-10-CM

## 2021-05-30 DIAGNOSIS — K2289 Esophageal dilatation: Secondary | ICD-10-CM

## 2021-07-09 ENCOUNTER — Encounter: Admit: 2021-07-09 | Discharge: 2021-07-09 | Payer: MEDICARE

## 2021-07-09 ENCOUNTER — Ambulatory Visit: Admit: 2021-07-09 | Discharge: 2021-07-09 | Payer: MEDICARE

## 2021-07-09 DIAGNOSIS — M1990 Unspecified osteoarthritis, unspecified site: Secondary | ICD-10-CM

## 2021-07-09 DIAGNOSIS — I10 Essential (primary) hypertension: Secondary | ICD-10-CM

## 2021-07-09 DIAGNOSIS — K2289 Esophageal dilatation: Secondary | ICD-10-CM

## 2021-07-09 DIAGNOSIS — E039 Hypothyroidism, unspecified: Secondary | ICD-10-CM

## 2021-07-09 DIAGNOSIS — H9190 Unspecified hearing loss, unspecified ear: Secondary | ICD-10-CM

## 2021-07-09 DIAGNOSIS — K449 Diaphragmatic hernia without obstruction or gangrene: Secondary | ICD-10-CM

## 2021-07-09 DIAGNOSIS — M549 Dorsalgia, unspecified: Secondary | ICD-10-CM

## 2021-07-09 DIAGNOSIS — K219 Gastro-esophageal reflux disease without esophagitis: Secondary | ICD-10-CM

## 2021-07-09 DIAGNOSIS — K635 Polyp of colon: Secondary | ICD-10-CM

## 2021-07-09 DIAGNOSIS — E785 Hyperlipidemia, unspecified: Secondary | ICD-10-CM

## 2021-07-09 DIAGNOSIS — N83209 Unspecified ovarian cyst, unspecified side: Secondary | ICD-10-CM

## 2021-07-17 ENCOUNTER — Encounter: Admit: 2021-07-17 | Discharge: 2021-07-17 | Payer: MEDICARE

## 2021-07-17 DIAGNOSIS — G4733 Obstructive sleep apnea (adult) (pediatric): Secondary | ICD-10-CM

## 2021-07-17 NOTE — Progress Notes
Home Sleep study found severe apnea with recommendation for In-lab titration study. Patient advised.

## 2021-07-25 ENCOUNTER — Encounter: Admit: 2021-07-25 | Discharge: 2021-07-25 | Payer: MEDICARE

## 2021-07-25 DIAGNOSIS — R0681 Apnea, not elsewhere classified: Secondary | ICD-10-CM

## 2021-07-25 NOTE — Progress Notes
Sleep study is abnormal.   AHI 70.3/hr  Nocturnal Hypoxemia     Recommend follow up with sleep clinic for further evaluation and management of sleep disorder.     Sleep study report will be scanned in EMR by the sleep lab, please see the final report for details.

## 2021-07-29 ENCOUNTER — Encounter: Admit: 2021-07-29 | Discharge: 2021-07-29 | Payer: MEDICARE

## 2021-07-29 DIAGNOSIS — G4733 Obstructive sleep apnea (adult) (pediatric): Secondary | ICD-10-CM

## 2021-07-30 ENCOUNTER — Ambulatory Visit: Admit: 2021-07-30 | Discharge: 2021-07-29 | Payer: MEDICARE

## 2021-07-30 ENCOUNTER — Encounter: Admit: 2021-07-30 | Discharge: 2021-07-30 | Payer: MEDICARE

## 2021-07-30 DIAGNOSIS — R0681 Apnea, not elsewhere classified: Secondary | ICD-10-CM

## 2021-07-30 NOTE — Progress Notes
Sleep study is abnormal.   Poor sleep during study.   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     Recommend follow up with sleep clinic for further evaluation and management of sleep disorder.     Sleep study report will be scanned in EMR by the sleep lab, please see the final report for details.

## 2021-07-31 ENCOUNTER — Encounter: Admit: 2021-07-31 | Discharge: 2021-07-31 | Payer: MEDICARE

## 2021-08-01 ENCOUNTER — Encounter: Admit: 2021-08-01 | Discharge: 2021-08-01 | Payer: MEDICARE

## 2021-08-01 NOTE — Progress Notes
Orders for CPAP faxed to [facility]:  CPAP order  NOC Ox on CPAP order  Demographics  Insurance information  Pulmonology clinic note  Sleep study reports

## 2021-08-04 ENCOUNTER — Encounter: Admit: 2021-08-04 | Discharge: 2021-08-04 | Payer: MEDICARE

## 2021-08-04 DIAGNOSIS — G4733 Obstructive sleep apnea (adult) (pediatric): Secondary | ICD-10-CM

## 2021-08-06 ENCOUNTER — Encounter: Admit: 2021-08-06 | Discharge: 2021-08-06 | Payer: MEDICARE

## 2021-08-27 ENCOUNTER — Encounter: Admit: 2021-08-27 | Discharge: 2021-08-27 | Payer: MEDICARE

## 2021-08-27 DIAGNOSIS — G4733 Obstructive sleep apnea (adult) (pediatric): Secondary | ICD-10-CM

## 2021-08-27 DIAGNOSIS — G4734 Idiopathic sleep related nonobstructive alveolar hypoventilation: Secondary | ICD-10-CM

## 2021-08-28 ENCOUNTER — Encounter: Admit: 2021-08-28 | Discharge: 2021-08-28 | Payer: MEDICARE

## 2021-08-28 NOTE — Telephone Encounter
Order for oxygen and noc ox on 2 LPM placed per recommendations.   DME: [facility]  Will continue to follow up.

## 2021-09-01 ENCOUNTER — Encounter: Admit: 2021-09-01 | Discharge: 2021-09-01 | Payer: MEDICARE

## 2021-09-01 DIAGNOSIS — G4733 Obstructive sleep apnea (adult) (pediatric): Secondary | ICD-10-CM

## 2021-09-01 DIAGNOSIS — G4734 Idiopathic sleep related nonobstructive alveolar hypoventilation: Secondary | ICD-10-CM

## 2021-09-01 NOTE — Telephone Encounter
Dicussed with Dr. Evonnie Pat in clinic. Verbal order to have patient increase O2 to 4L at night through CPAP.     Orders faxed to [facility] and patient updated.

## 2021-09-01 NOTE — Telephone Encounter
RN contacted patient to discuss Noc ox. Results say pt did test from 9am to 5pm. CPAP usage for that day says patient wore from 9pm to 5am. RN contacted patient to see what time she wore the noc ox. Pt confirms over night while wearing CPAP.     DL reviewed and AHI less than 5. Will discuss with Provider.     Pt also says [facility] states she is supposed to be changing her filters. She went through everything she got from then and does not have any filters. RN will [employer] teacher.     Pt also asking about oxygen when traveling. Updated

## 2021-09-03 ENCOUNTER — Encounter: Admit: 2021-09-03 | Discharge: 2021-09-03 | Payer: MEDICARE

## 2021-09-03 ENCOUNTER — Ambulatory Visit: Admit: 2021-09-03 | Discharge: 2021-09-03 | Payer: MEDICARE

## 2021-09-03 DIAGNOSIS — E039 Hypothyroidism, unspecified: Secondary | ICD-10-CM

## 2021-09-03 DIAGNOSIS — M1990 Unspecified osteoarthritis, unspecified site: Secondary | ICD-10-CM

## 2021-09-03 DIAGNOSIS — K635 Polyp of colon: Secondary | ICD-10-CM

## 2021-09-03 DIAGNOSIS — E785 Hyperlipidemia, unspecified: Secondary | ICD-10-CM

## 2021-09-03 DIAGNOSIS — D509 Iron deficiency anemia, unspecified: Secondary | ICD-10-CM

## 2021-09-03 DIAGNOSIS — K2289 Esophageal dilatation: Secondary | ICD-10-CM

## 2021-09-03 DIAGNOSIS — K449 Diaphragmatic hernia without obstruction or gangrene: Secondary | ICD-10-CM

## 2021-09-03 DIAGNOSIS — K219 Gastro-esophageal reflux disease without esophagitis: Secondary | ICD-10-CM

## 2021-09-03 DIAGNOSIS — I10 Essential (primary) hypertension: Secondary | ICD-10-CM

## 2021-09-03 DIAGNOSIS — N83209 Unspecified ovarian cyst, unspecified side: Secondary | ICD-10-CM

## 2021-09-03 DIAGNOSIS — H9190 Unspecified hearing loss, unspecified ear: Secondary | ICD-10-CM

## 2021-09-03 DIAGNOSIS — M549 Dorsalgia, unspecified: Secondary | ICD-10-CM

## 2021-09-03 LAB — FERRITIN: FERRITIN: 25 ng/mL (ref 10–200)

## 2021-09-03 MED ORDER — ROPINIROLE 0.5 MG PO TAB
.5 mg | ORAL_TABLET | Freq: Every evening | ORAL | 3 refills | Status: AC
Start: 2021-09-03 — End: ?

## 2021-09-03 NOTE — Patient Instructions
-   If you have any questions regarding your appointment or any concerns, please contact Deloss Amico, RN at [phone].    - If you need to schedule or reschedule an appointment, please call [phone].     -For refills on medications, please have your pharmacy fax a refill authorization request form to our office at [phone]. Please allow at least 3 business days for refill requests.    - For any urgent issues after business hours, please call [phone] and have the on call pulmonary physician paged.

## 2021-09-04 ENCOUNTER — Encounter: Admit: 2021-09-04 | Discharge: 2021-09-04 | Payer: MEDICARE

## 2021-09-04 DIAGNOSIS — M549 Dorsalgia, unspecified: Secondary | ICD-10-CM

## 2021-09-04 DIAGNOSIS — E785 Hyperlipidemia, unspecified: Secondary | ICD-10-CM

## 2021-09-04 DIAGNOSIS — H9190 Unspecified hearing loss, unspecified ear: Secondary | ICD-10-CM

## 2021-09-04 DIAGNOSIS — K219 Gastro-esophageal reflux disease without esophagitis: Secondary | ICD-10-CM

## 2021-09-04 DIAGNOSIS — M1990 Unspecified osteoarthritis, unspecified site: Secondary | ICD-10-CM

## 2021-09-04 DIAGNOSIS — K635 Polyp of colon: Secondary | ICD-10-CM

## 2021-09-04 DIAGNOSIS — K449 Diaphragmatic hernia without obstruction or gangrene: Secondary | ICD-10-CM

## 2021-09-04 DIAGNOSIS — K2289 Esophageal dilatation: Secondary | ICD-10-CM

## 2021-09-04 DIAGNOSIS — I10 Essential (primary) hypertension: Secondary | ICD-10-CM

## 2021-09-04 DIAGNOSIS — E039 Hypothyroidism, unspecified: Secondary | ICD-10-CM

## 2021-09-04 DIAGNOSIS — N83209 Unspecified ovarian cyst, unspecified side: Secondary | ICD-10-CM

## 2021-09-09 ENCOUNTER — Encounter: Admit: 2021-09-09 | Discharge: 2021-09-09 | Payer: MEDICARE

## 2021-09-09 NOTE — Telephone Encounter
The patient's PAP f/u office visit notes from 09/03/21 were faxed to their DME, [facility].

## 2021-09-29 ENCOUNTER — Encounter: Admit: 2021-09-29 | Discharge: 2021-09-29 | Payer: MEDICARE

## 2021-09-29 NOTE — Telephone Encounter
Pt currently in [place] and plans on flying to [facility] later in Dec then returning to [place]. She will need night time oxygen while back in [facility]. RN contacted Engineer, production to discuss. If patient calls them this week they will have plenty of time to arrange this. RN contacted patient and updated.

## 2021-10-28 ENCOUNTER — Encounter: Admit: 2021-10-28 | Discharge: 2021-10-28 | Payer: MEDICARE

## 2021-10-28 NOTE — Telephone Encounter
Pre-visit planning for 11/04/21 appointment with Rhoderick Moody.    SS 07/29/21  AHI: 28.6    2 mo follow up    LV: 09/03/21 with Dr. Kandis Ban    Iron deficiency anemia  +ferrous sulfate oral BID    RLS  -checking ferritin and replacing if less than 50    Nocturnal Hypoxia  -4L bleed in to CPAP    OSA    aCPAP 6-15 cmH2O  Set up: 08/08/21  DME: [facility]  Compliance: met.    RN obtained download from AV.

## 2021-11-04 ENCOUNTER — Encounter: Admit: 2021-11-04 | Discharge: 2021-11-04 | Payer: MEDICARE

## 2021-11-04 ENCOUNTER — Ambulatory Visit: Admit: 2021-11-04 | Discharge: 2021-11-04 | Payer: MEDICARE

## 2021-11-04 DIAGNOSIS — E785 Hyperlipidemia, unspecified: Secondary | ICD-10-CM

## 2021-11-04 DIAGNOSIS — G4733 Obstructive sleep apnea (adult) (pediatric): Principal | ICD-10-CM

## 2021-11-04 DIAGNOSIS — G2581 Restless legs syndrome: Secondary | ICD-10-CM

## 2021-11-04 DIAGNOSIS — K635 Polyp of colon: Secondary | ICD-10-CM

## 2021-11-04 DIAGNOSIS — I10 Essential (primary) hypertension: Secondary | ICD-10-CM

## 2021-11-04 DIAGNOSIS — M549 Dorsalgia, unspecified: Secondary | ICD-10-CM

## 2021-11-04 DIAGNOSIS — G4734 Idiopathic sleep related nonobstructive alveolar hypoventilation: Secondary | ICD-10-CM

## 2021-11-04 DIAGNOSIS — K2289 Esophageal dilatation: Secondary | ICD-10-CM

## 2021-11-04 DIAGNOSIS — E039 Hypothyroidism, unspecified: Secondary | ICD-10-CM

## 2021-11-04 DIAGNOSIS — N83209 Unspecified ovarian cyst, unspecified side: Secondary | ICD-10-CM

## 2021-11-04 DIAGNOSIS — K219 Gastro-esophageal reflux disease without esophagitis: Secondary | ICD-10-CM

## 2021-11-04 DIAGNOSIS — M1990 Unspecified osteoarthritis, unspecified site: Secondary | ICD-10-CM

## 2021-11-04 DIAGNOSIS — K449 Diaphragmatic hernia without obstruction or gangrene: Secondary | ICD-10-CM

## 2021-11-04 DIAGNOSIS — H9190 Unspecified hearing loss, unspecified ear: Secondary | ICD-10-CM

## 2021-11-04 NOTE — Assessment & Plan Note
Good compliance.  No pressure change.   Residual <5.  Benefits from therapy.  Knows to get replacement supplies.

## 2021-11-04 NOTE — Telephone Encounter
Contacted [employer] to discuss patient Garment/textile technologist out of pocket. Per Zenaida Deed, that is possible. Order placed. Will fax once signed.     Jacques Navy, RN

## 2021-11-04 NOTE — Assessment & Plan Note
Continue iron one tablet with docusate to prevent constipation. Did not tolerate higher doses.   Can keep requip on hand to use PRN if needed. Not currently needing.

## 2021-11-04 NOTE — Assessment & Plan Note
Will send order to [employer] to see if she can get a portable concentrator for travel. Discussed insurance unlikely to cover and pt prepared to pay out of pocket.

## 2021-11-05 ENCOUNTER — Encounter: Admit: 2021-11-05 | Discharge: 2021-11-05 | Payer: MEDICARE

## 2021-11-05 DIAGNOSIS — M1990 Unspecified osteoarthritis, unspecified site: Secondary | ICD-10-CM

## 2021-11-05 DIAGNOSIS — K219 Gastro-esophageal reflux disease without esophagitis: Secondary | ICD-10-CM

## 2021-11-05 DIAGNOSIS — E785 Hyperlipidemia, unspecified: Secondary | ICD-10-CM

## 2021-11-05 DIAGNOSIS — N83209 Unspecified ovarian cyst, unspecified side: Secondary | ICD-10-CM

## 2021-11-05 DIAGNOSIS — H9190 Unspecified hearing loss, unspecified ear: Secondary | ICD-10-CM

## 2021-11-05 DIAGNOSIS — M549 Dorsalgia, unspecified: Secondary | ICD-10-CM

## 2021-11-05 DIAGNOSIS — K2289 Esophageal dilatation: Secondary | ICD-10-CM

## 2021-11-05 DIAGNOSIS — K449 Diaphragmatic hernia without obstruction or gangrene: Secondary | ICD-10-CM

## 2021-11-05 DIAGNOSIS — E039 Hypothyroidism, unspecified: Secondary | ICD-10-CM

## 2021-11-05 DIAGNOSIS — K635 Polyp of colon: Secondary | ICD-10-CM

## 2021-11-05 DIAGNOSIS — I10 Essential (primary) hypertension: Secondary | ICD-10-CM

## 2022-04-30 ENCOUNTER — Encounter: Admit: 2022-04-30 | Discharge: 2022-04-30 | Payer: MEDICARE

## 2022-04-30 ENCOUNTER — Ambulatory Visit: Admit: 2022-04-30 | Discharge: 2022-05-01 | Payer: MEDICARE

## 2022-04-30 DIAGNOSIS — K219 Gastro-esophageal reflux disease without esophagitis: Secondary | ICD-10-CM

## 2022-04-30 DIAGNOSIS — G4733 Obstructive sleep apnea (adult) (pediatric): Secondary | ICD-10-CM

## 2022-04-30 DIAGNOSIS — M1990 Unspecified osteoarthritis, unspecified site: Secondary | ICD-10-CM

## 2022-04-30 DIAGNOSIS — K449 Diaphragmatic hernia without obstruction or gangrene: Secondary | ICD-10-CM

## 2022-04-30 DIAGNOSIS — E039 Hypothyroidism, unspecified: Secondary | ICD-10-CM

## 2022-04-30 DIAGNOSIS — H9190 Unspecified hearing loss, unspecified ear: Secondary | ICD-10-CM

## 2022-04-30 DIAGNOSIS — K2289 Esophageal dilatation: Secondary | ICD-10-CM

## 2022-04-30 DIAGNOSIS — N83209 Unspecified ovarian cyst, unspecified side: Secondary | ICD-10-CM

## 2022-04-30 DIAGNOSIS — E785 Hyperlipidemia, unspecified: Secondary | ICD-10-CM

## 2022-04-30 DIAGNOSIS — G4734 Idiopathic sleep related nonobstructive alveolar hypoventilation: Secondary | ICD-10-CM

## 2022-04-30 DIAGNOSIS — M549 Dorsalgia, unspecified: Secondary | ICD-10-CM

## 2022-04-30 DIAGNOSIS — K635 Polyp of colon: Secondary | ICD-10-CM

## 2022-04-30 DIAGNOSIS — I10 Essential (primary) hypertension: Secondary | ICD-10-CM

## 2022-05-04 ENCOUNTER — Encounter: Admit: 2022-05-04 | Discharge: 2022-05-04 | Payer: MEDICARE

## 2022-05-04 NOTE — Telephone Encounter
Order for noc ox on cpap and O2 4L placed as discussed per office note on 04/30/2022   DME: [facility]  Will continue to follow up.

## 2022-05-29 ENCOUNTER — Encounter: Admit: 2022-05-29 | Discharge: 2022-05-29 | Payer: MEDICARE

## 2022-05-29 NOTE — Telephone Encounter
Order for oxygen and noc ox on cpap placed per recommendations.   DME: [facility]  Will continue to follow up.

## 2022-06-17 ENCOUNTER — Encounter: Admit: 2022-06-17 | Discharge: 2022-06-17 | Payer: MEDICARE

## 2022-06-17 NOTE — Telephone Encounter
Duthuluru, Madelaine Etienne, MD  Brynda Greathouse, RN  Please let her know that noc-ox came back normal. TY     Message sent.

## 2022-06-17 NOTE — Telephone Encounter
Call to patient who states that after her test on July 14 she had her oxygen increased to 5L and she had a second oximetry test done the last week of July around 06/01/22. Confirmed these results are in the chart under media tab and they are normal results. Pt states that she will be going [place] and asked if she should use her portable oxygen at 5 liters. Encouraged her to use it at its prescribed level of 5L.

## 2022-06-17 NOTE — Telephone Encounter
Duthuluru, Stephanie Etienne, MD  You 2 hours ago (12:55 PM)     Her noc-ox showed she desaturated for 10 hrs below 88%. She is already on 4 liters of O2. We will need to increase O2 to 5 liters and repeat noc-ox. If repeat noc-ox shows persistent desaturation, she will need in lab BIPAP titration.      Message sent to Dr. Evonnie Pat to clarify ONO results.

## 2022-10-26 ENCOUNTER — Ambulatory Visit: Admit: 2022-10-26 | Discharge: 2022-10-27 | Payer: MEDICARE

## 2022-10-26 ENCOUNTER — Encounter: Admit: 2022-10-26 | Discharge: 2022-10-26 | Payer: MEDICARE

## 2022-10-26 DIAGNOSIS — E785 Hyperlipidemia, unspecified: Secondary | ICD-10-CM

## 2022-10-26 DIAGNOSIS — M549 Dorsalgia, unspecified: Secondary | ICD-10-CM

## 2022-10-26 DIAGNOSIS — K219 Gastro-esophageal reflux disease without esophagitis: Secondary | ICD-10-CM

## 2022-10-26 DIAGNOSIS — M1990 Unspecified osteoarthritis, unspecified site: Secondary | ICD-10-CM

## 2022-10-26 DIAGNOSIS — K2289 Esophageal dilatation: Secondary | ICD-10-CM

## 2022-10-26 DIAGNOSIS — N83209 Unspecified ovarian cyst, unspecified side: Secondary | ICD-10-CM

## 2022-10-26 DIAGNOSIS — K635 Polyp of colon: Secondary | ICD-10-CM

## 2022-10-26 DIAGNOSIS — I10 Essential (primary) hypertension: Secondary | ICD-10-CM

## 2022-10-26 DIAGNOSIS — E039 Hypothyroidism, unspecified: Secondary | ICD-10-CM

## 2022-10-26 DIAGNOSIS — H9190 Unspecified hearing loss, unspecified ear: Secondary | ICD-10-CM

## 2022-10-26 DIAGNOSIS — K449 Diaphragmatic hernia without obstruction or gangrene: Secondary | ICD-10-CM

## 2022-10-26 NOTE — Patient Instructions
Please contact Sarah RN for any questions or concerns at [phone]

## 2022-10-27 ENCOUNTER — Encounter: Admit: 2022-10-27 | Discharge: 2022-10-27 | Payer: MEDICARE

## 2022-10-27 DIAGNOSIS — K2289 Esophageal dilatation: Secondary | ICD-10-CM

## 2022-10-27 DIAGNOSIS — K219 Gastro-esophageal reflux disease without esophagitis: Secondary | ICD-10-CM

## 2022-10-27 DIAGNOSIS — K449 Diaphragmatic hernia without obstruction or gangrene: Secondary | ICD-10-CM

## 2022-10-27 DIAGNOSIS — E039 Hypothyroidism, unspecified: Secondary | ICD-10-CM

## 2022-10-27 DIAGNOSIS — E785 Hyperlipidemia, unspecified: Secondary | ICD-10-CM

## 2022-10-27 DIAGNOSIS — M549 Dorsalgia, unspecified: Secondary | ICD-10-CM

## 2022-10-27 DIAGNOSIS — M1990 Unspecified osteoarthritis, unspecified site: Secondary | ICD-10-CM

## 2022-10-27 DIAGNOSIS — H9190 Unspecified hearing loss, unspecified ear: Secondary | ICD-10-CM

## 2022-10-27 DIAGNOSIS — N83209 Unspecified ovarian cyst, unspecified side: Secondary | ICD-10-CM

## 2022-10-27 DIAGNOSIS — I10 Essential (primary) hypertension: Secondary | ICD-10-CM

## 2022-10-27 DIAGNOSIS — K635 Polyp of colon: Secondary | ICD-10-CM

## 2023-04-26 ENCOUNTER — Encounter: Admit: 2023-04-26 | Discharge: 2023-04-26 | Payer: MEDICARE

## 2023-04-26 ENCOUNTER — Ambulatory Visit: Admit: 2023-04-26 | Discharge: 2023-04-26 | Payer: MEDICARE

## 2023-04-26 DIAGNOSIS — R011 Cardiac murmur, unspecified: Secondary | ICD-10-CM

## 2023-06-29 ENCOUNTER — Encounter: Admit: 2023-06-29 | Discharge: 2023-06-29 | Payer: MEDICARE

## 2023-06-29 NOTE — Telephone Encounter
 Received request from DME for provider to sign form necessary for billing. CMN for oxygen concentrator . Form completed and given to provider for signature.    Form will be faxed to  [facility].

## 2023-08-04 ENCOUNTER — Encounter: Admit: 2023-08-04 | Discharge: 2023-08-04 | Payer: MEDICARE

## 2023-08-04 ENCOUNTER — Ambulatory Visit: Admit: 2023-08-04 | Discharge: 2023-08-05 | Payer: MEDICARE

## 2023-08-04 DIAGNOSIS — M1990 Unspecified osteoarthritis, unspecified site: Secondary | ICD-10-CM

## 2023-08-04 DIAGNOSIS — K449 Diaphragmatic hernia without obstruction or gangrene: Secondary | ICD-10-CM

## 2023-08-04 DIAGNOSIS — I10 Essential (primary) hypertension: Secondary | ICD-10-CM

## 2023-08-04 DIAGNOSIS — H9190 Unspecified hearing loss, unspecified ear: Secondary | ICD-10-CM

## 2023-08-04 DIAGNOSIS — K219 Gastro-esophageal reflux disease without esophagitis: Secondary | ICD-10-CM

## 2023-08-04 DIAGNOSIS — K2289 Esophageal dilatation: Secondary | ICD-10-CM

## 2023-08-04 DIAGNOSIS — K635 Polyp of colon: Secondary | ICD-10-CM

## 2023-08-04 DIAGNOSIS — M549 Dorsalgia, unspecified: Secondary | ICD-10-CM

## 2023-08-04 DIAGNOSIS — E785 Hyperlipidemia, unspecified: Secondary | ICD-10-CM

## 2023-08-04 DIAGNOSIS — E039 Hypothyroidism, unspecified: Secondary | ICD-10-CM

## 2023-08-04 DIAGNOSIS — N83209 Unspecified ovarian cyst, unspecified side: Secondary | ICD-10-CM

## 2023-08-04 NOTE — Patient Instructions
Please contact Maralyn Sago RN for any questions or concerns at [phone]

## 2023-12-28 IMAGING — US ABDLM
1 series · 14 of 25 positions shown · non-contrast
Comparison: none

[Series 1: us abdomen limited · 14 of 84 slices shown]
[im 1/84]
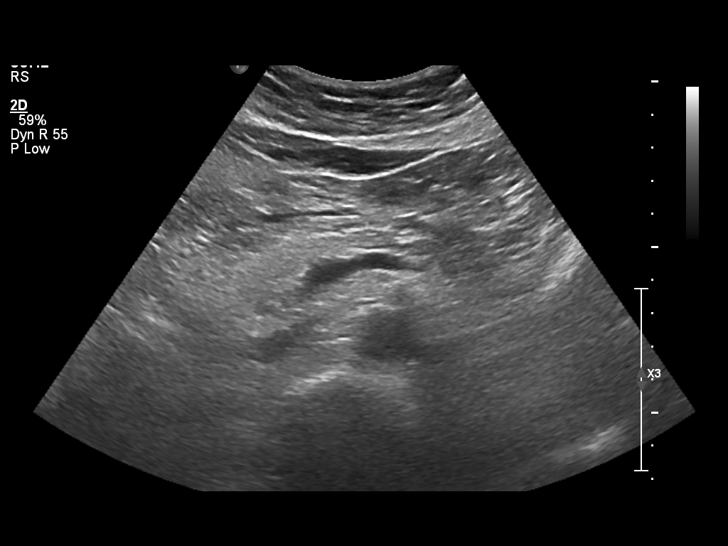
[im 7/84]
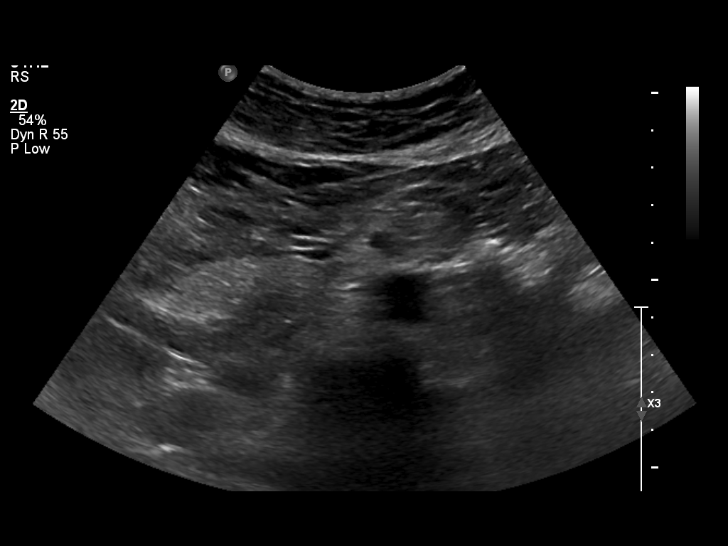
[im 14/84]
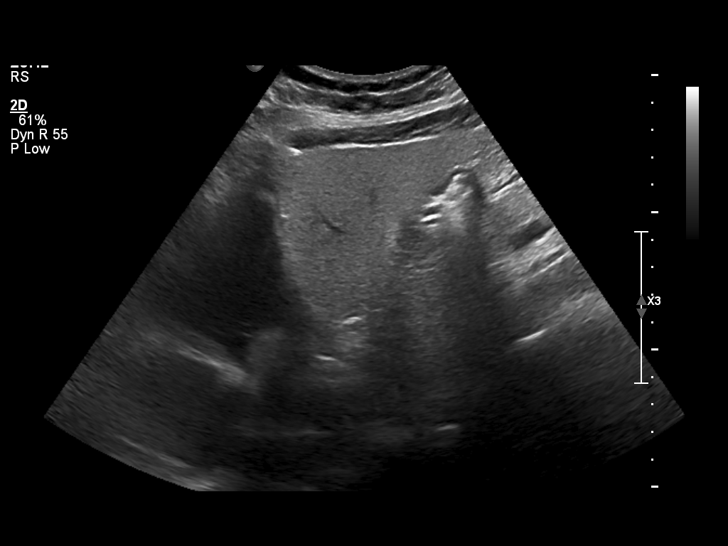
[im 21/84]
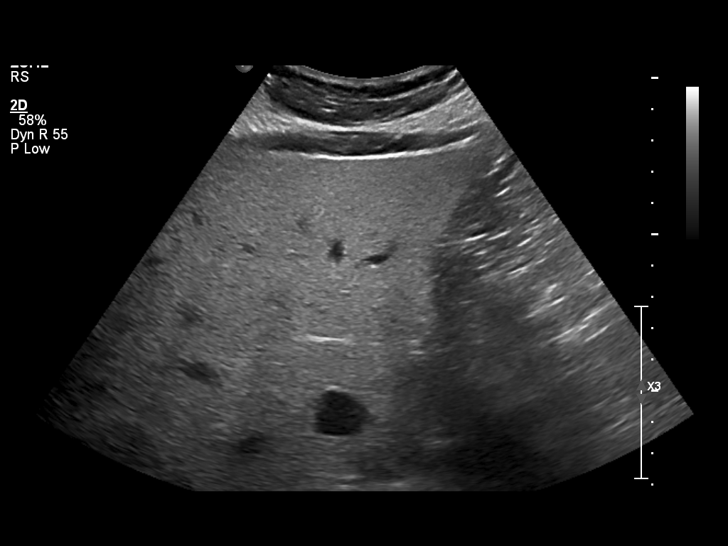
[im 28/84]
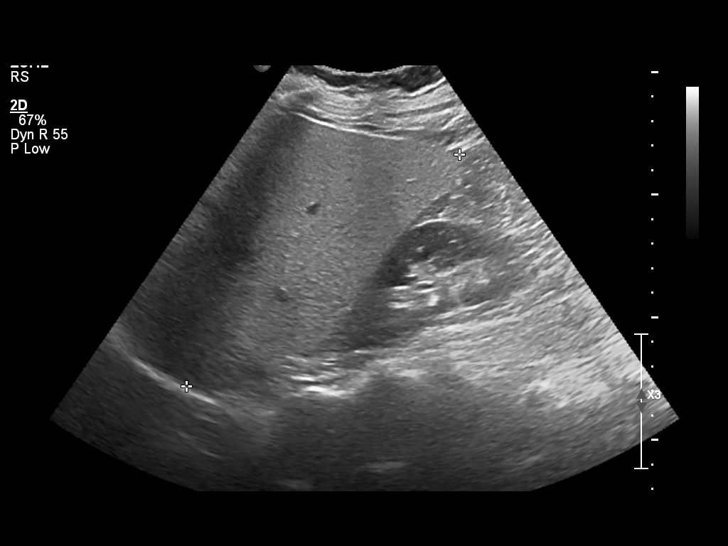
[im 32/84]
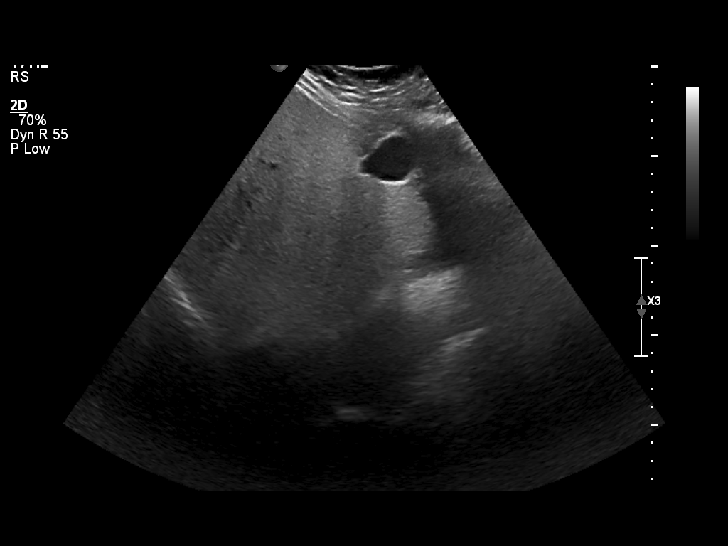
[im 39/84]
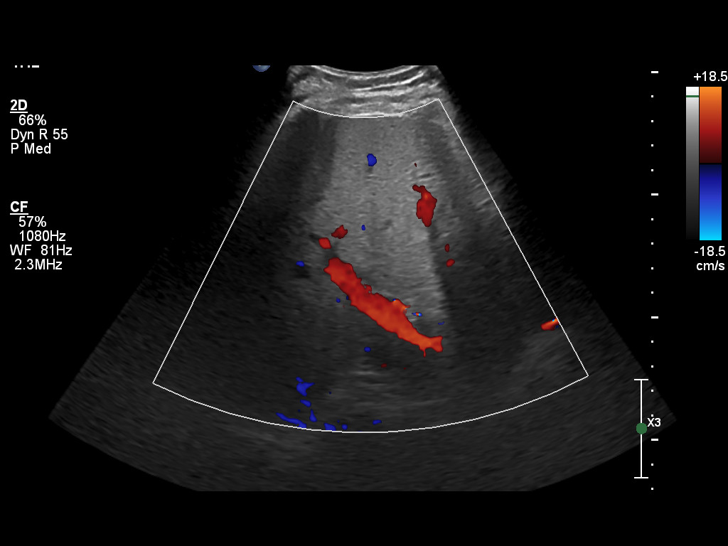
[im 45/84]
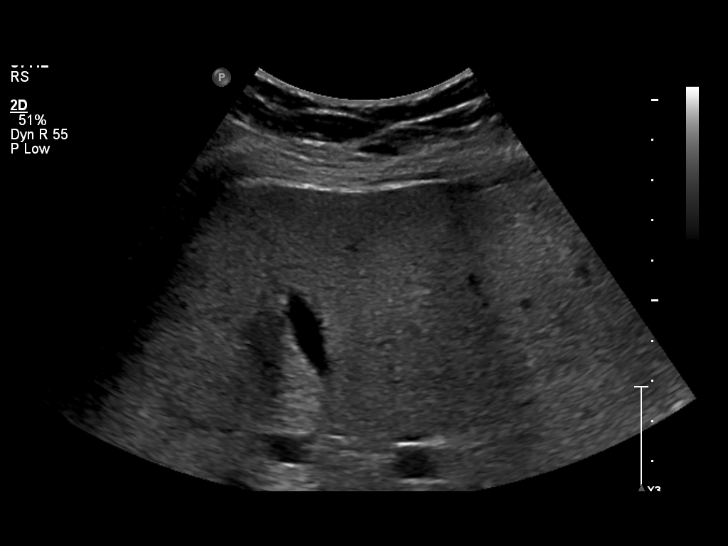
[im 52/84]
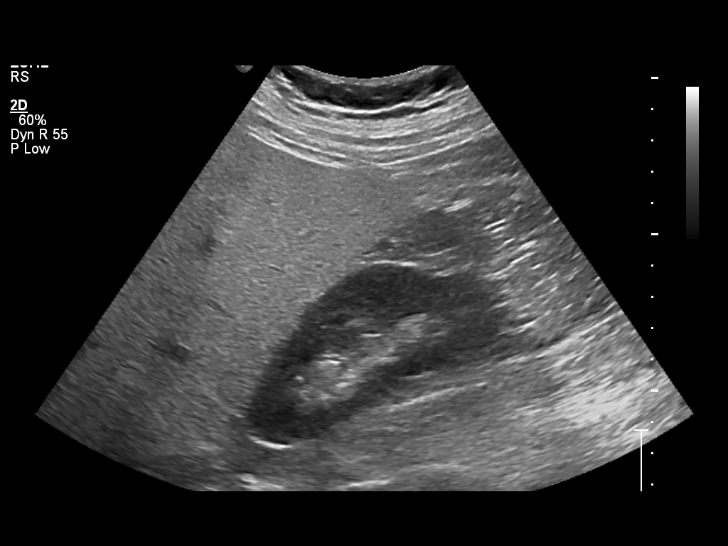
[im 56/84]
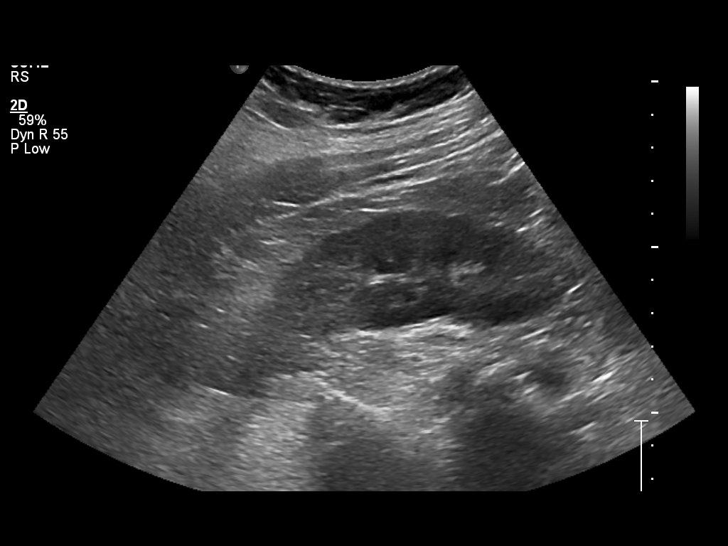
[im 63/84]
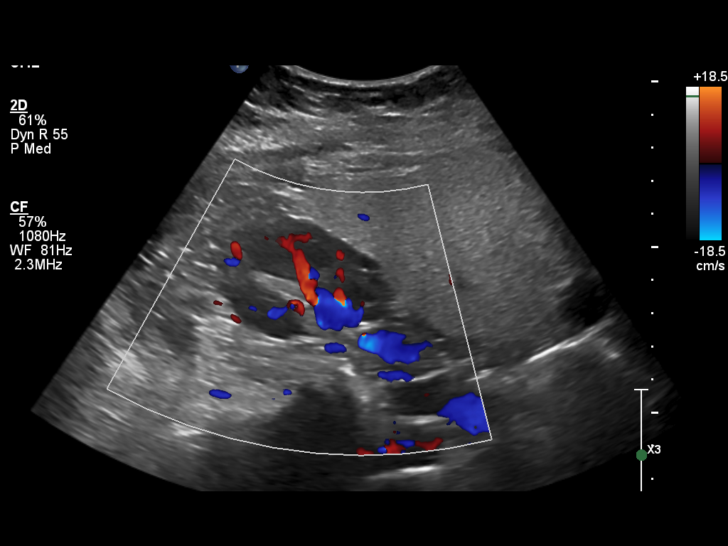
[im 70/84]
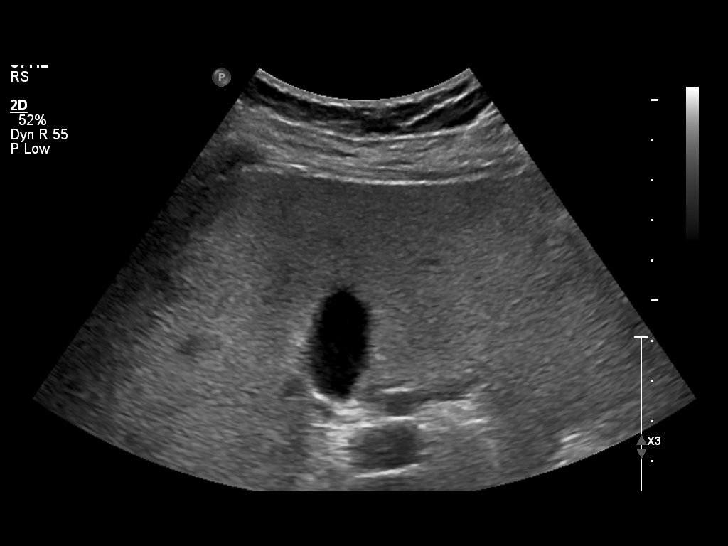
[im 77/84]
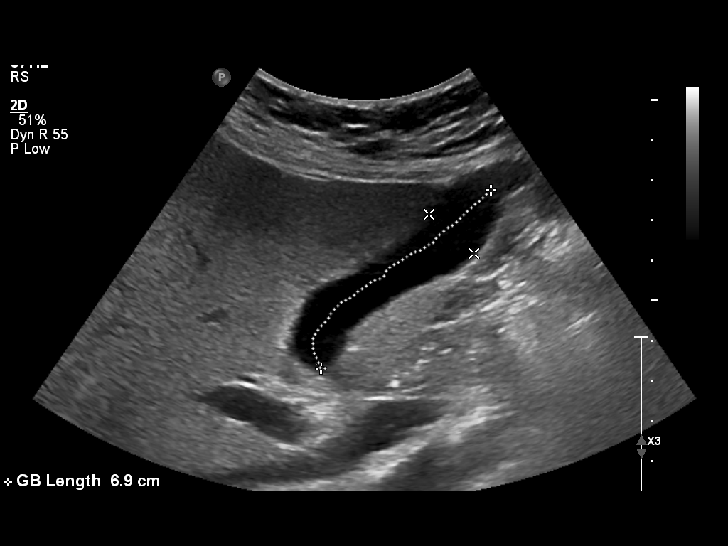
[im 84/84]
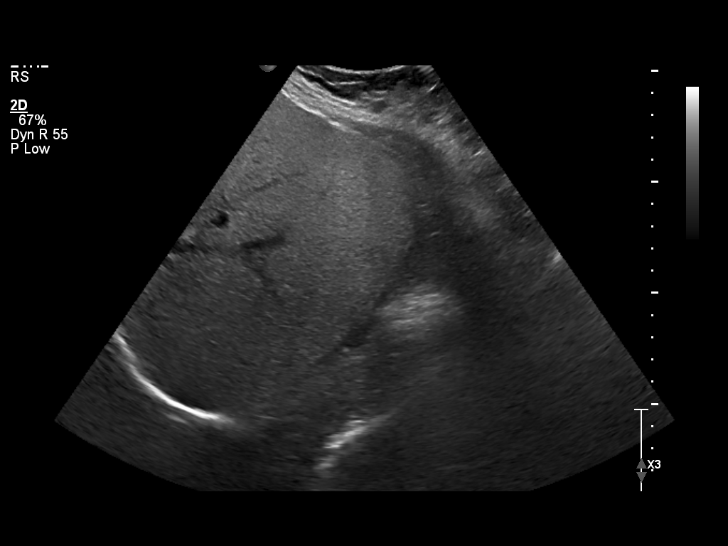

[14 of 25 positions shown; findings below may reference images not displayed]

EXAM

Right upper quadrant ultrasound

INDICATION

elevated LFT
elevated LFT

TECHNIQUE

Grayscale and limited color Doppler imaging of the right upper quadrant

COMPARISONS

None available

FINDINGS

Liver: Measures 14.6 cm. Increased echogenicity. No focal lesion is identified.

Gallbladder: Thin walled, no stones or sludge.

Common bile duct: Measures 0.3 cm.

Pancreas: Imaged aspect is unremarkable.

Right kidney: Measures 10.2 cm in length.  No hydronephrosis or focal lesion identified.

Patent intrahepatic IVC.  Imaged abdominal aorta is normal in caliber.

IMPRESSION

Hepatic steatosis. No acute findings.

Tech Notes:

elevated LFT

## 2024-03-30 ENCOUNTER — Encounter: Admit: 2024-03-30 | Discharge: 2024-03-30 | Payer: MEDICARE

## 2024-05-19 ENCOUNTER — Encounter: Admit: 2024-05-19 | Discharge: 2024-05-19 | Payer: MEDICARE

## 2024-06-07 ENCOUNTER — Encounter: Admit: 2024-06-07 | Discharge: 2024-06-07 | Payer: MEDICARE

## 2024-06-07 ENCOUNTER — Ambulatory Visit: Admit: 2024-06-07 | Discharge: 2024-06-07 | Payer: MEDICARE

## 2024-06-07 ENCOUNTER — Ambulatory Visit: Admit: 2024-06-07 | Discharge: 2024-06-08 | Payer: MEDICARE

## 2024-06-07 DIAGNOSIS — R42 Dizziness and giddiness: Principal | ICD-10-CM

## 2024-06-07 DIAGNOSIS — H903 Sensorineural hearing loss, bilateral: Principal | ICD-10-CM

## 2024-06-07 NOTE — Progress Notes
 Date of Service: 06/07/2024    Subjective:             Stephanie Green is a 78 y.o. female.    History of Present Illness    Stephanie Green is here as part of work up/evaluation for dizziness.    She described the dizziness as a disconnected type of a feeling.  There is no room spin.  This started 6-7 years ago, initially saw a neurologist who dismissed it as BPPV and sent her to a physical therapist.  While her neck felt better with therapy, she did not get any improvement in her then intermittent dizziness.    Over the last 6+ mo her symptoms have become constant.  She had to stop playing golf last week after 2 holes due to feeling so off.      She denies acute hearing changes or associated aural fullness.      Past Medical History:    Back pain    Colon polyp    Esophageal dilatation    GERD (gastroesophageal reflux disease)    Hearing reduced    Hiatal hernia    HLD (hyperlipidemia)    HTN (hypertension)    Hypothyroid    Osteoarthritis    Ovarian cyst                    Objective:         amLODIPine (NORVASC) 5 mg tablet Take one tablet by mouth daily.    atorvastatin (LIPITOR) 20 mg tablet Take one tablet by mouth daily.    calcium carbonate/vitamin D3 (CALCIUM + D PO) Take 1 capsule by mouth daily.    cetirizine (ZYRTEC) 10 mg tablet Take one tablet by mouth at bedtime as needed.    docusate (COLACE) 100 mg capsule Take one capsule by mouth twice daily.    famotidine (PEPCID) 40 mg tablet Take one tablet by mouth daily.    ferrous sulfate (FEOSOL) 325 mg (65 mg iron) tablet Take one tablet by mouth daily. Take on an empty stomach at least 1 hour before or 2 hours after food.    ibuprofen  (MOTRIN ) 600 mg tablet Take one tablet by mouth every 6 hours. Take with food.    levothyroxine (SYNTHROID) 25 mcg tablet Take one tablet by mouth daily 30 minutes before breakfast.    losartan  (COZAAR ) 100 mg tablet Take one tablet by mouth daily.    meclizine (ANTIVERT) 25 mg tablet Take one tablet by mouth daily as needed.    oxygen-air delivery systems (HORIZON NASAL CPAP SYSTEM MISC) Use  as directed.     Vitals:    06/07/24 1311   BP: (!) 145/86   BP Source: Arm, Left Upper   Pulse: 61   Temp: 36.3 ?C (97.3 ?F)   TempSrc: Temporal   PainSc: Zero   Weight: 69.9 kg (154 lb)   Height: 149.9 cm (4' 11)     Body mass index is 31.1 kg/m?SABRA     Physical Exam    General appearance: well developed, well nourished, no acute distress  Communication ability: communicates by voice, normal quality  Psychiatric: oriented to person, place and time with appropriate affect  Inspection: normocephalic, no scars, lesions, or masses.  Facial motion grossly intact.  External ear: normal, no lesions or deformities no post auricular fluctuance or tenderness.  Otoscopic: canals clear, tympanic membranes intact, no fluid  External nose: normal, no lesions or deformities  Nasal: mucosa, septum, and turbinates  normal  Lips/teeth/gums: normal dentition, no gingival inflammation, no labial lesions  Oropharynx: tongue normal, posterior pharynx without erythema or exudate.  Hard and soft palate without focal lesion.  Neck: supple, no mass or lesion.  No asymmetry, trachea normal position, no crepitus.  Thyroid without nodules or tenderness.  NEUROTO:  Head thrust was negative for catch up saccades.  Ordinary romberg was negative for fall or sway.  Eyes are PERRLA.  EOMs intact without nystagmus noted.  Cranial nerves II-XII are otherwise intact grossly.    Audiogram obtained today and reviewed by me today reveals normal to moderately severe/severe sensorineural hearing loss bilaterally.  Word discrimination is good; speech reception thresholds are appropriate.  Tympanogram is within normal limits as well.     Assessment and Plan:    1. Dizziness        2. Sensorineural hearing loss (SNHL) of both ears          Her exam is unremarkable for focal deficit or peripheral vestibulopathy.    I do not believe her dizziness/disconnected sensation is related to inner ear disease and rather suspect underlying neurologic issue.  Next steps could include imaging (MRI) and reassessment by neurology as it has been > 5 years since last seen.  She may also benefit from an assessment by a local vestibular therapist.  She will follow up with her PCP to further discuss.    She does have sensorineural hearing loss; we briefly discussed amplification.  I recommended [employer] due to her splitting time between [place] and [place], in case she needed service etc.      I am happy to see her back PRN.    All questions were answered to the best of my ability.  Niels Jenkins Files expressed understanding and appreciation.

## 2024-07-27 ENCOUNTER — Encounter: Admit: 2024-07-27 | Discharge: 2024-07-27 | Payer: MEDICARE

## 2024-08-07 ENCOUNTER — Encounter: Admit: 2024-08-07 | Discharge: 2024-08-07 | Payer: MEDICARE

## 2024-08-07 ENCOUNTER — Ambulatory Visit: Admit: 2024-08-07 | Discharge: 2024-08-08 | Payer: MEDICARE

## 2024-08-18 ENCOUNTER — Encounter: Admit: 2024-08-18 | Discharge: 2024-08-18 | Payer: MEDICARE
